# Patient Record
Sex: Female | Born: 1945 | Race: White | Hispanic: No | Smoking: Current every day smoker
Health system: Southern US, Community
[De-identification: ages and names within clinical notes are randomized; demographics above are authoritative.]

## PROBLEM LIST (undated history)

## (undated) DIAGNOSIS — N186 End stage renal disease: Secondary | ICD-10-CM

## (undated) HISTORY — PX: KNEE SURGERY: SHX244

## (undated) HISTORY — PX: HIP SURGERY: SHX245

## (undated) HISTORY — PX: GASTRIC BYPASS: SHX52

## (undated) HISTORY — DX: End stage renal disease: N18.6

## (undated) HISTORY — PX: CATARACT EXTRACTION: SUR2

## (undated) HISTORY — PX: BACK SURGERY: SHX140

## (undated) HISTORY — PX: VEIN LIGATION AND STRIPPING: SHX2653

## (undated) HISTORY — PX: HERNIA REPAIR: SHX51

## (undated) HISTORY — PX: CARDIAC PACEMAKER PLACEMENT: SHX583

## (undated) HISTORY — PX: CHOLECYSTECTOMY: SHX55

---

## 1978-07-30 DIAGNOSIS — I82419 Acute embolism and thrombosis of unspecified femoral vein: Secondary | ICD-10-CM | POA: Insufficient documentation

## 1978-07-30 HISTORY — DX: Acute embolism and thrombosis of unspecified femoral vein: I82.419

## 2000-04-19 ENCOUNTER — Encounter: Payer: Self-pay | Admitting: *Deleted

## 2000-04-19 ENCOUNTER — Encounter: Admission: RE | Admit: 2000-04-19 | Discharge: 2000-04-19 | Payer: Self-pay | Admitting: *Deleted

## 2000-04-20 ENCOUNTER — Other Ambulatory Visit: Admission: RE | Admit: 2000-04-20 | Discharge: 2000-04-20 | Payer: Self-pay | Admitting: *Deleted

## 2002-04-29 ENCOUNTER — Other Ambulatory Visit: Admission: RE | Admit: 2002-04-29 | Discharge: 2002-04-29 | Payer: Self-pay | Admitting: Obstetrics and Gynecology

## 2003-05-19 ENCOUNTER — Other Ambulatory Visit: Admission: RE | Admit: 2003-05-19 | Discharge: 2003-05-19 | Payer: Self-pay | Admitting: *Deleted

## 2006-07-18 ENCOUNTER — Encounter: Admission: RE | Admit: 2006-07-18 | Discharge: 2006-07-18 | Payer: Self-pay | Admitting: Orthopedic Surgery

## 2006-07-24 ENCOUNTER — Ambulatory Visit (HOSPITAL_BASED_OUTPATIENT_CLINIC_OR_DEPARTMENT_OTHER): Admission: RE | Admit: 2006-07-24 | Discharge: 2006-07-24 | Payer: Self-pay | Admitting: Orthopedic Surgery

## 2006-08-23 ENCOUNTER — Ambulatory Visit (HOSPITAL_BASED_OUTPATIENT_CLINIC_OR_DEPARTMENT_OTHER): Admission: RE | Admit: 2006-08-23 | Discharge: 2006-08-23 | Payer: Self-pay | Admitting: Orthopedic Surgery

## 2006-09-20 ENCOUNTER — Encounter: Admission: RE | Admit: 2006-09-20 | Discharge: 2006-09-20 | Payer: Self-pay | Admitting: *Deleted

## 2008-03-14 IMAGING — US US EXTREM LOW VENOUS*L*
1 series · 14 of 24 positions shown · non-contrast
Comparison: none

CLINICAL DATA: Left leg pain

LEFT LOWER EXTREMITY VENOUS DOPPLER ULTRASOUND:
TECHNIQUE: Gray-scale sonography with compression, as well as color and duplex
Doppler ultrasound, were performed to evaluate the deep venous system from the
level of the common femoral vein through the popliteal and proximal calf veins.

[Series 1: unknown · 14 of 32 slices shown]
[im 1/32]
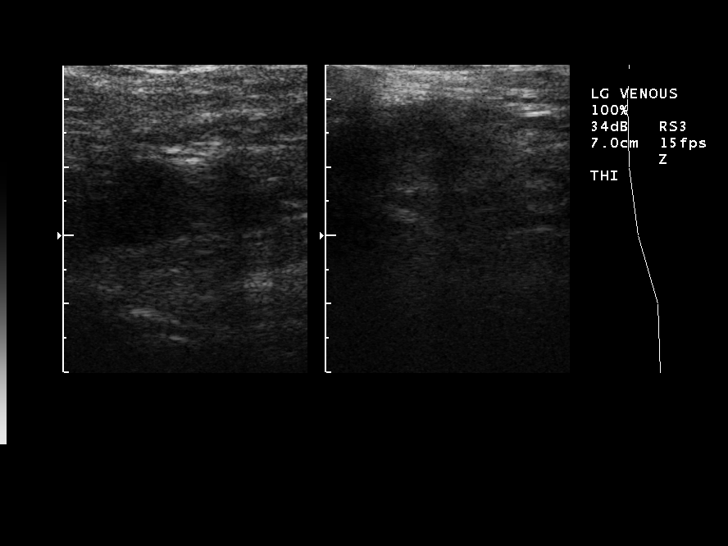
[im 3/32]
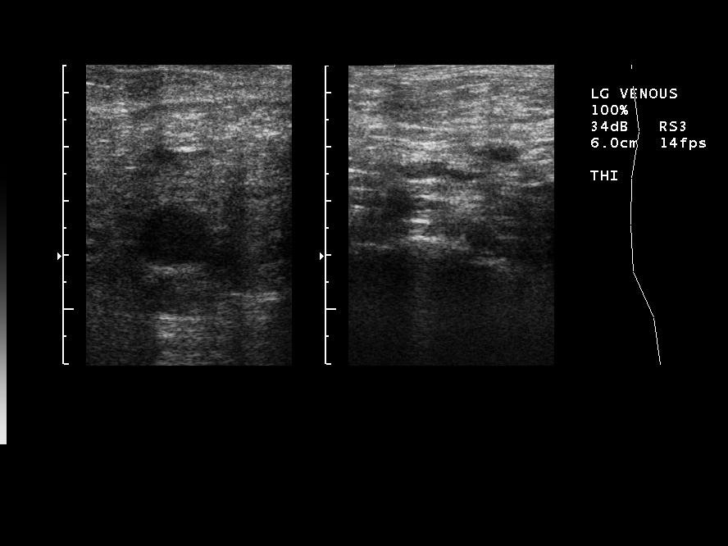
[im 6/32]
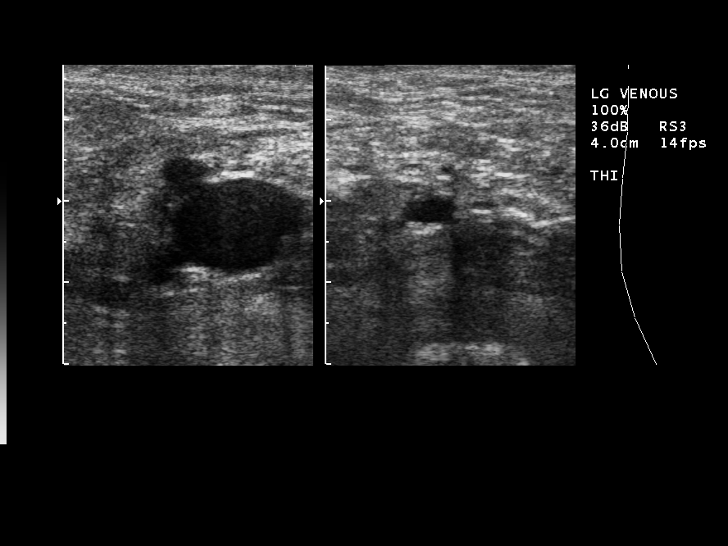
[im 9/32]
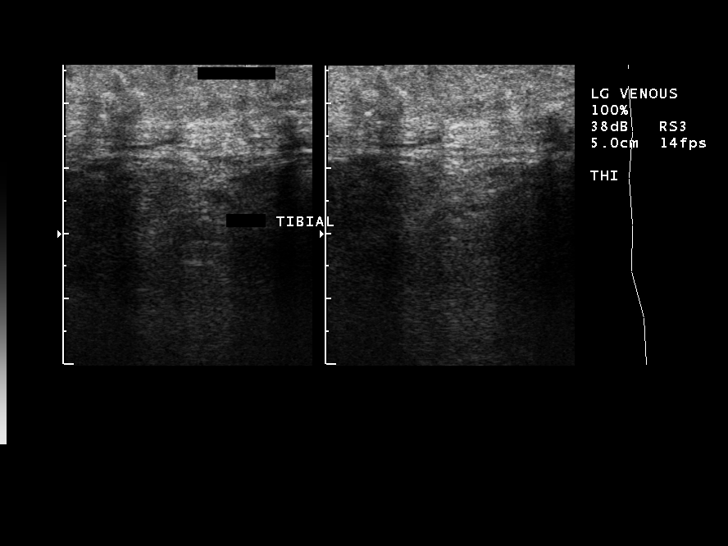
[im 10/32]
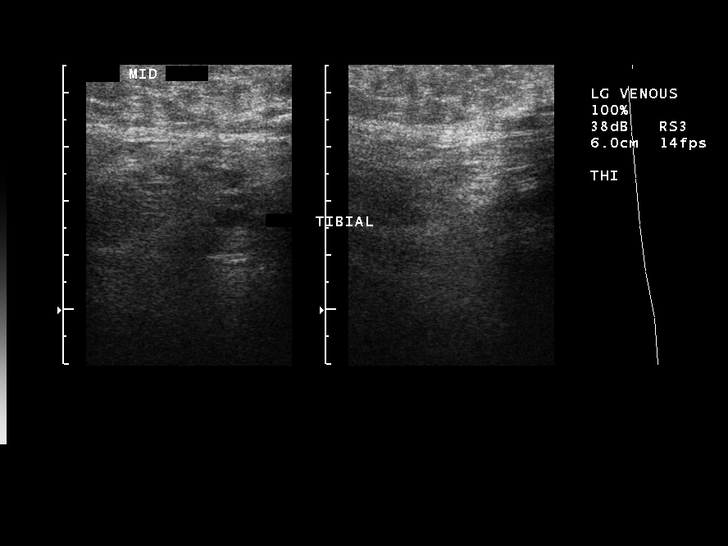
[im 13/32]
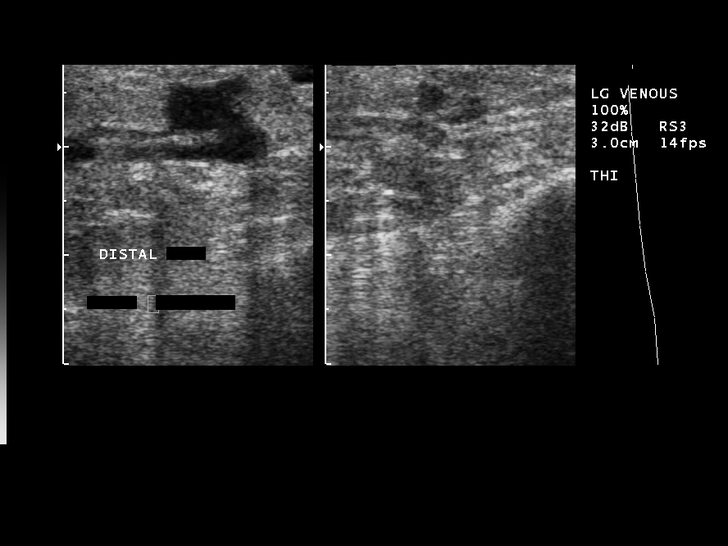
[im 15/32]
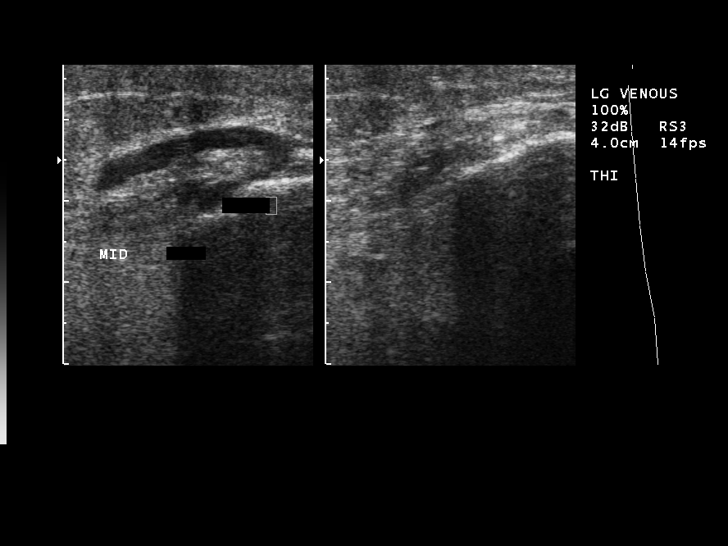
[im 17/32]
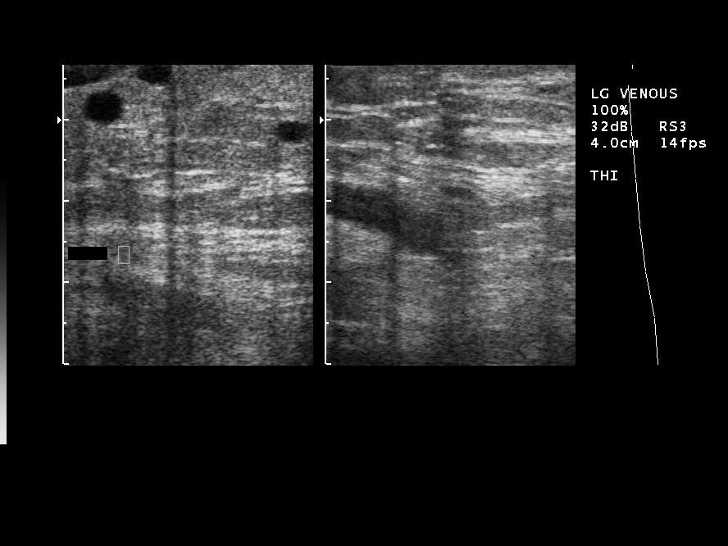
[im 19/32]
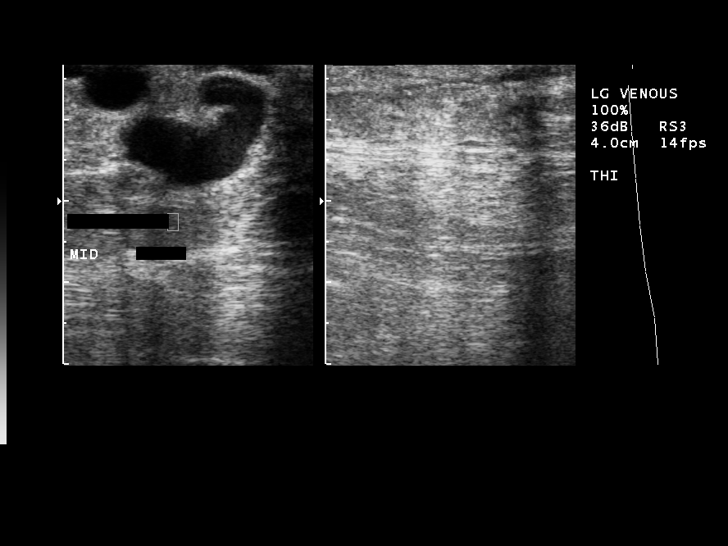
[im 22/32]
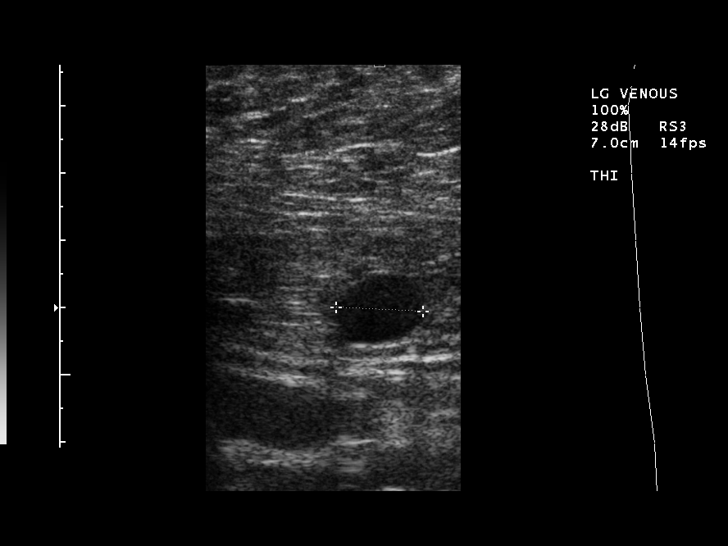
[im 25/32]
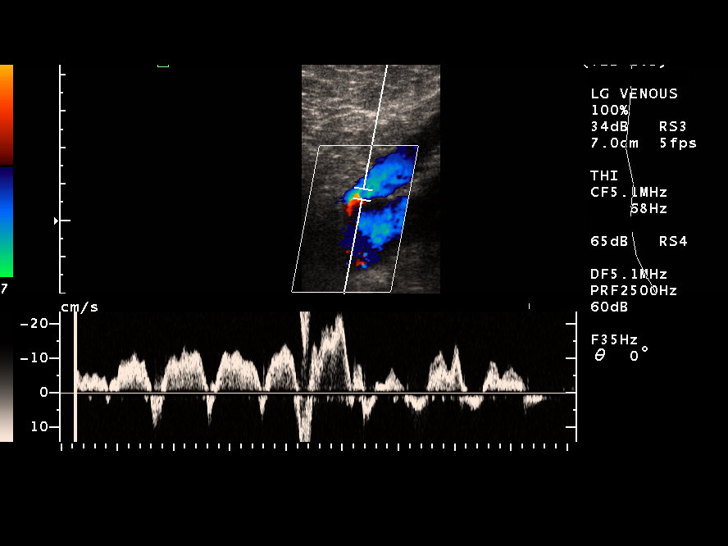
[im 26/32]
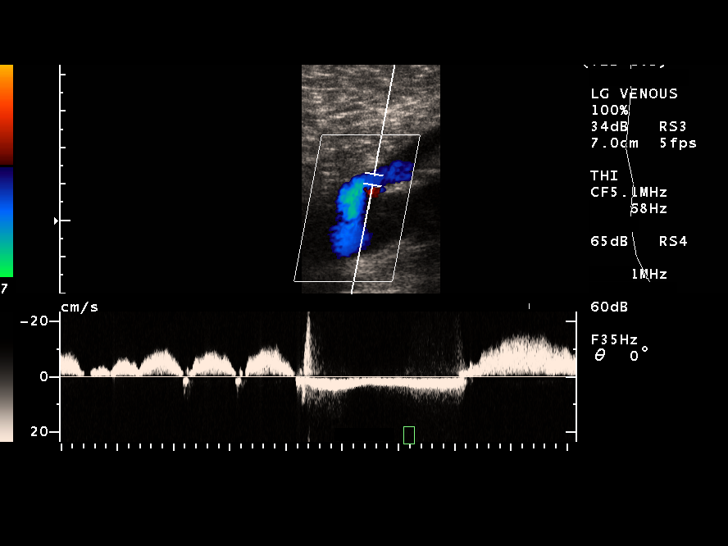
[im 29/32]
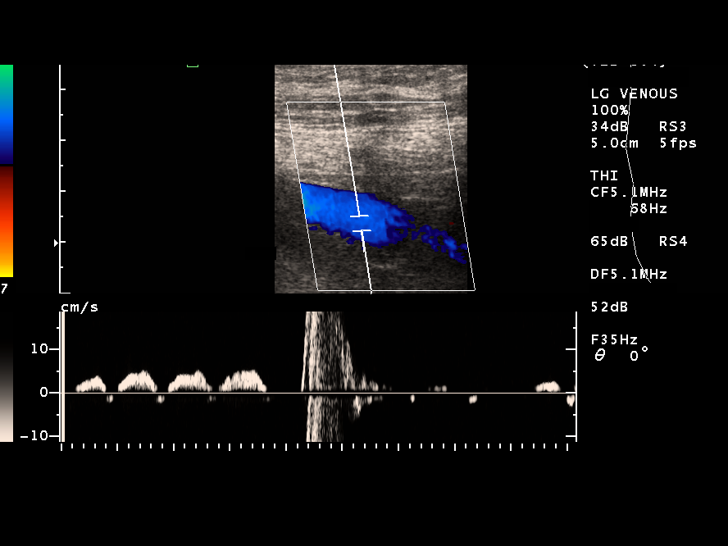
[im 32/32]
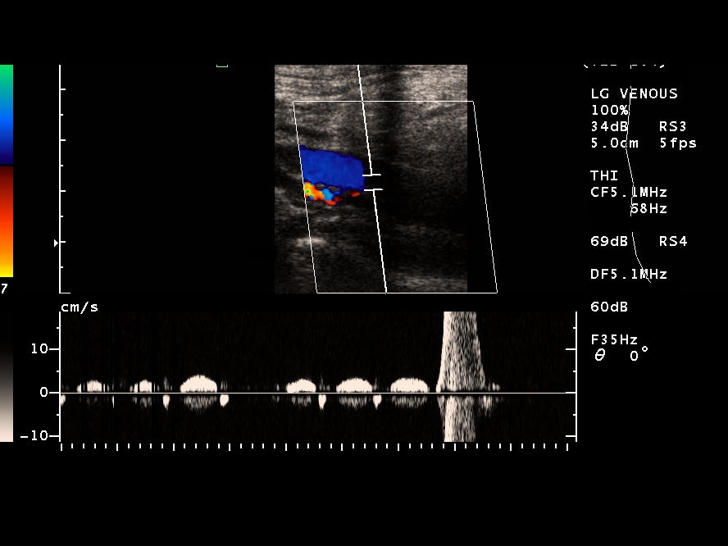

[14 of 24 positions shown; findings below may reference images not displayed]

FINDINGS: The deep venous system is unremarkable normal compressibility,
augmentation, and phasicity. There is reflux seen at the saphenofemoral junction
and within the left great saphenous vein which leads to large varicosities.
cm Baker's cyst noted posterior to the left knee.
IMPRESSION: No evidence of left lower extremity DVT.

There is great saphenous vein reflux and multiple varicosities noted. If
symptoms are felt to be referrable to venous insufficiency, the patient could
have a consultation and vein mapping for venous insufficiency.

## 2010-09-26 ENCOUNTER — Other Ambulatory Visit (HOSPITAL_COMMUNITY): Payer: Self-pay | Admitting: Nephrology

## 2010-09-26 DIAGNOSIS — R809 Proteinuria, unspecified: Secondary | ICD-10-CM

## 2010-09-28 ENCOUNTER — Other Ambulatory Visit: Payer: Self-pay | Admitting: Nephrology

## 2010-09-28 ENCOUNTER — Encounter (HOSPITAL_COMMUNITY): Payer: Self-pay

## 2010-09-28 ENCOUNTER — Encounter (HOSPITAL_COMMUNITY): Payer: Medicare Other | Attending: Nephrology

## 2010-09-28 DIAGNOSIS — N179 Acute kidney failure, unspecified: Secondary | ICD-10-CM | POA: Insufficient documentation

## 2010-09-28 DIAGNOSIS — D638 Anemia in other chronic diseases classified elsewhere: Secondary | ICD-10-CM | POA: Insufficient documentation

## 2010-09-28 LAB — POCT HEMOGLOBIN-HEMACUE: Hemoglobin: 8.3 g/dL — ABNORMAL LOW (ref 12.0–15.0)

## 2010-10-05 ENCOUNTER — Other Ambulatory Visit: Payer: Self-pay | Admitting: Interventional Radiology

## 2010-10-05 ENCOUNTER — Ambulatory Visit (HOSPITAL_COMMUNITY)
Admission: RE | Admit: 2010-10-05 | Discharge: 2010-10-05 | Disposition: A | Discharge status: Home / Self Care | Payer: Medicare Other | Source: Ambulatory Visit | Attending: Nephrology | Admitting: Nephrology

## 2010-10-05 DIAGNOSIS — R809 Proteinuria, unspecified: Secondary | ICD-10-CM

## 2010-10-05 DIAGNOSIS — I129 Hypertensive chronic kidney disease with stage 1 through stage 4 chronic kidney disease, or unspecified chronic kidney disease: Secondary | ICD-10-CM | POA: Insufficient documentation

## 2010-10-05 DIAGNOSIS — N189 Chronic kidney disease, unspecified: Secondary | ICD-10-CM | POA: Insufficient documentation

## 2010-10-05 LAB — CBC
Hemoglobin: 8.2 g/dL — ABNORMAL LOW (ref 12.0–15.0)
MCH: 29.3 pg (ref 26.0–34.0)
MCHC: 31.7 g/dL (ref 30.0–36.0)
RDW: 14.6 % (ref 11.5–15.5)

## 2010-10-05 LAB — PROTIME-INR: Prothrombin Time: 14.8 seconds (ref 11.6–15.2)

## 2010-10-07 ENCOUNTER — Encounter (HOSPITAL_COMMUNITY)
Admission: RE | Admit: 2010-10-07 | Discharge: 2010-10-07 | Disposition: A | Discharge status: Home / Self Care | Payer: Medicare Other | Source: Ambulatory Visit | Attending: Nephrology | Admitting: Nephrology

## 2010-10-07 ENCOUNTER — Other Ambulatory Visit: Payer: Self-pay | Admitting: Nephrology

## 2010-10-07 DIAGNOSIS — D638 Anemia in other chronic diseases classified elsewhere: Secondary | ICD-10-CM | POA: Insufficient documentation

## 2010-10-07 DIAGNOSIS — N179 Acute kidney failure, unspecified: Secondary | ICD-10-CM | POA: Insufficient documentation

## 2010-10-17 ENCOUNTER — Encounter (HOSPITAL_COMMUNITY): Payer: Medicare Other

## 2010-10-19 ENCOUNTER — Other Ambulatory Visit: Payer: Self-pay | Admitting: Nephrology

## 2010-10-19 ENCOUNTER — Encounter (HOSPITAL_COMMUNITY): Payer: Medicare Other

## 2010-10-19 LAB — IRON AND TIBC
Iron: 32 ug/dL — ABNORMAL LOW (ref 42–135)
Saturation Ratios: 13 % — ABNORMAL LOW (ref 20–55)
UIBC: 214 ug/dL

## 2010-10-19 LAB — FERRITIN: Ferritin: 646 ng/mL — ABNORMAL HIGH (ref 10–291)

## 2010-10-19 LAB — POCT HEMOGLOBIN-HEMACUE: Hemoglobin: 10.6 g/dL — ABNORMAL LOW (ref 12.0–15.0)

## 2010-10-26 ENCOUNTER — Ambulatory Visit (INDEPENDENT_AMBULATORY_CARE_PROVIDER_SITE_OTHER): Payer: Medicare Other | Admitting: Vascular Surgery

## 2010-10-26 ENCOUNTER — Other Ambulatory Visit: Payer: Self-pay | Admitting: Nephrology

## 2010-10-26 ENCOUNTER — Encounter (INDEPENDENT_AMBULATORY_CARE_PROVIDER_SITE_OTHER): Payer: Medicare Other

## 2010-10-26 ENCOUNTER — Encounter (HOSPITAL_COMMUNITY): Payer: Medicare Other

## 2010-10-26 DIAGNOSIS — N184 Chronic kidney disease, stage 4 (severe): Secondary | ICD-10-CM

## 2010-10-26 DIAGNOSIS — Z01810 Encounter for preprocedural cardiovascular examination: Secondary | ICD-10-CM

## 2010-10-26 DIAGNOSIS — N186 End stage renal disease: Secondary | ICD-10-CM

## 2010-10-27 NOTE — Consult Note (Signed)
NEW PATIENT CONSULTATION  Holly Pittman, Holly Pittman DOB:  12-06-1945                                       10/26/2010 [id number]  I saw patient in the office today to evaluate her for hemodialysis access.  She is referred by Dr. Justin Mend.  This is Pittman pleasant 65 year old woman with gradually worsening renal insufficiency of unknown etiology. In reviewing Dr. Jason Nest records, it appears she has an inflammatory disorder involving her kidneys.  She has apparently undergone Pittman renal biopsy, but I do not have those results.  She has had some generalized fatigue and weakness.  She has had no other uremic symptoms. Specifically, she denies nausea, vomiting, palpitations, shortness of breath, or sweats.  PAST MEDICAL HISTORY:  Significant for hypothyroidism, fibromyalgia, and benign hypertension.  She denies any history of diabetes, hypercholesterolemia, history of previous myocardial infarction, or history of congestive heart failure.  SOCIAL HISTORY:  She is single.  She has 2 children.  She smokes less than 1 pack per day of cigarettes.  She does not drink alcohol on Pittman regular basis.  FAMILY HISTORY:  She is unaware of any history of premature cardiovascular disease.  REVIEW OF SYSTEMS:  GENERAL:  She has had some weight loss and loss of appetite. CARDIOVASCULAR:  She has had no chest pain, chest pressure, palpitations, or arrhythmias.  She has had no history of stroke, TIAs or amaurosis fugax.  She has had no history of DVT or phlebitis. GI:  She has occasional constipation. NEUROLOGIC:  She has occasional dizziness. HEMATOLOGIC:  She does have Pittman history of anemia. GU:  She has some urinary frequency. MUSCULOSKELETAL:  She does have Pittman history of arthritis, joint pain, and muscle pain. PSYCHIATRIC:  She has had issues with depression and anxiety. Pulmonary, integumentary review of systems is unremarkable and is documented on the medical history form in her  chart.  PHYSICAL EXAMINATION:  This is Pittman pleasant 65 year old woman who appears her stated age.  Blood pressure is 114/72, heart rate is 83, saturation 98%.  HEENT:  Unremarkable.  Lungs are clear bilaterally to auscultation without rales, rhonchi or wheezing.  Cardiovascular:  I do not detect any carotid bruits.  She has Pittman regular rate and rhythm.  Abdomen:  Soft and nontender with normal-pitched bowel sounds.  Musculoskeletal:  No major deformities or cyanosis.  Extremities:  She has palpable brachial and radial pulses bilaterally.  Neurologic:  She has no focal weakness or paresthesias.  Skin:  There are no ulcers or rashes.  I have independently interpreted her vein mapping, which shows that her forearm cephalic vein on the left is very small.  She does not appear to be Pittman candidate for Pittman radiocephalic fistula.  The upper arm cephalic vein appears reasonable in size, and the basilic vein appears reasonable in size on the left.  Cephalic vein on the right is marginal in size in the forearm and marginal in size in the upper arm.  I have also reviewed her labs that were sent from Dr. Jason Nest office on October 11, 2010.  Hemoglobin was 10.6, creatinine 5.83.  I have recommended that we explore her upper arm cephalic vein on the left, and if this is adequate, place an upper arm fistula.  If this is not adequate, we could explore her basilic vein for Pittman possible basilic vein transposition.  If neither is  adequate, she could potentially require an AV graft.  I have discussed the indications for surgery and the potential complications, including but not limited to bleeding, wound healing problems, failure of the fistula to mature, graft thrombosis, graft infection, or steal syndrome.  All of her questions were answered.  She is agreeable to proceed.  Her surgery has been scheduled for May 1.    Judeth Cornfield. Scot Dock, M.D. Electronically Signed  CSD/MEDQ  D:  10/26/2010  T:  10/27/2010   Job:  [id number]  cc:   Sherril Croon, M.D.

## 2010-11-01 ENCOUNTER — Ambulatory Visit (HOSPITAL_COMMUNITY): Payer: Medicare Other

## 2010-11-01 ENCOUNTER — Ambulatory Visit (HOSPITAL_COMMUNITY)
Admission: RE | Admit: 2010-11-01 | Discharge: 2010-11-01 | Disposition: A | Discharge status: Home / Self Care | Payer: Medicare Other | Source: Ambulatory Visit | Attending: Vascular Surgery | Admitting: Vascular Surgery

## 2010-11-01 DIAGNOSIS — Z01810 Encounter for preprocedural cardiovascular examination: Secondary | ICD-10-CM | POA: Insufficient documentation

## 2010-11-01 DIAGNOSIS — N186 End stage renal disease: Secondary | ICD-10-CM

## 2010-11-01 DIAGNOSIS — I129 Hypertensive chronic kidney disease with stage 1 through stage 4 chronic kidney disease, or unspecified chronic kidney disease: Secondary | ICD-10-CM | POA: Insufficient documentation

## 2010-11-01 DIAGNOSIS — Z01818 Encounter for other preprocedural examination: Secondary | ICD-10-CM | POA: Insufficient documentation

## 2010-11-01 DIAGNOSIS — I120 Hypertensive chronic kidney disease with stage 5 chronic kidney disease or end stage renal disease: Secondary | ICD-10-CM

## 2010-11-01 DIAGNOSIS — F3289 Other specified depressive episodes: Secondary | ICD-10-CM | POA: Insufficient documentation

## 2010-11-01 DIAGNOSIS — IMO0001 Reserved for inherently not codable concepts without codable children: Secondary | ICD-10-CM | POA: Insufficient documentation

## 2010-11-01 DIAGNOSIS — F329 Major depressive disorder, single episode, unspecified: Secondary | ICD-10-CM | POA: Insufficient documentation

## 2010-11-01 DIAGNOSIS — N189 Chronic kidney disease, unspecified: Secondary | ICD-10-CM | POA: Insufficient documentation

## 2010-11-01 DIAGNOSIS — Z01812 Encounter for preprocedural laboratory examination: Secondary | ICD-10-CM | POA: Insufficient documentation

## 2010-11-01 LAB — POCT I-STAT 4, (NA,K, GLUC, HGB,HCT)
Glucose, Bld: 75 mg/dL (ref 70–99)
HCT: 39 % (ref 36.0–46.0)

## 2010-11-01 LAB — SURGICAL PCR SCREEN: Staphylococcus aureus: NEGATIVE

## 2010-11-02 ENCOUNTER — Encounter (HOSPITAL_COMMUNITY): Payer: Medicare Other

## 2010-11-02 NOTE — Op Note (Signed)
  NAMECOURTANY, Holly Pittman             ACCOUNT NO.:  [id number]  MEDICAL RECORD NO.:  [id number]           PATIENT TYPE:  O  LOCATION:  [facility]                         FACILITY:  [facility]  PHYSICIAN:  Judeth Cornfield. Scot Dock, M.D.DATE OF BIRTH:  [birth year]  DATE OF PROCEDURE:  11/01/2010 DATE OF DISCHARGE:  11/01/2010                              OPERATIVE REPORT   PREOPERATIVE DIAGNOSIS:  Chronic kidney disease.  POSTOPERATIVE DIAGNOSIS:  Chronic kidney disease.  PROCEDURE:  Placement of a left brachiocephalic AV fistula.  SURGEON:  Judeth Cornfield. Scot Dock, MD  ANESTHESIA:  Local with sedation.  TECHNIQUE:  The patient was taken to the operating room, sedated by Anesthesia.  The left upper extremity was prepped and draped in usual sterile fashion.  The upper arm cephalic vein appeared to be adequate size for fistula by preoperative duplex.  Transverse incision was made just above the antecubital level and here the cephalic vein was dissected free and ligated distally, irrigated it nicely with heparinized saline.  The brachial artery was dissected free beneath the fascia and the patient was heparinized.  The brachial artery was clamped proximally and distally and a longitudinal arteriotomy was made.  The vein was cut to the appropriate length and sewn end-to-side to the artery using continuous 6-0 Prolene suture.  At the completion, there was an excellent thrill in the fistula with a palpable radial pulse. Heparin was partially reversed with protamine.  The wound was closed with deep layer of 3-0 Vicryl.  The skin was closed with 4-0 Vicryl. Sterile dressing was applied.  The patient tolerated the procedure well and was transferred to the recovery room in stable condition.  All needle and sponge counts were correct.     Judeth Cornfield. Scot Dock, M.D.     CSD/MEDQ  D:  11/01/2010  T:  11/02/2010  Job:  [id number]  cc:   Sherril Croon, M.D.  Electronically Signed by Deitra Mayo M.D. on 11/02/2010 08:00:47 AM

## 2010-11-02 NOTE — Procedures (Unsigned)
CEPHALIC VEIN MAPPING  INDICATION:  Chronic kidney disease, stage IV.  HISTORY: Currently smoking.  EXAM: The right cephalic vein is compressible with diameter measurements ranging from 0.09 cm to 0.37 cm.  The right basilic vein is compressible with diameter measurements ranging from 0.29 cm to 0.44 cm.  The left cephalic vein is compressible with diameter measurements ranging from 0.10 to 0.44 cm.  The left basilic vein is compressible with diameter measurements ranging from 0.27 to 0.33 cm.  See attached worksheet for all measurements.  IMPRESSION: 1. Patent bilateral cephalic and basilic veins with diameter     measurements as described above. 2. Bilateral basilic veins are noted to bifurcate at the distal upper     arm/antecubital fossa segment.  ___________________________________________ Judeth Cornfield. Scot Dock, M.D.  SH/MEDQ  D:  10/26/2010  T:  10/26/2010  Job:  [id number]

## 2010-11-15 ENCOUNTER — Encounter (HOSPITAL_COMMUNITY): Payer: Medicare Other

## 2010-11-24 ENCOUNTER — Other Ambulatory Visit: Payer: Self-pay | Admitting: Nephrology

## 2010-11-24 ENCOUNTER — Encounter (HOSPITAL_COMMUNITY): Payer: Medicare Other | Attending: Nephrology

## 2010-11-24 DIAGNOSIS — N179 Acute kidney failure, unspecified: Secondary | ICD-10-CM | POA: Insufficient documentation

## 2010-11-24 DIAGNOSIS — D638 Anemia in other chronic diseases classified elsewhere: Secondary | ICD-10-CM | POA: Insufficient documentation

## 2010-11-24 LAB — POCT HEMOGLOBIN-HEMACUE: Hemoglobin: 11.9 g/dL — ABNORMAL LOW (ref 12.0–15.0)

## 2010-11-24 LAB — IRON AND TIBC: Iron: 105 ug/dL (ref 42–135)

## 2010-12-01 ENCOUNTER — Encounter (HOSPITAL_COMMUNITY): Payer: Medicare Other

## 2010-12-08 ENCOUNTER — Other Ambulatory Visit: Payer: Self-pay | Admitting: Nephrology

## 2010-12-08 ENCOUNTER — Encounter (HOSPITAL_COMMUNITY): Payer: Medicare Other | Attending: Nephrology

## 2010-12-08 DIAGNOSIS — N179 Acute kidney failure, unspecified: Secondary | ICD-10-CM | POA: Insufficient documentation

## 2010-12-08 DIAGNOSIS — D638 Anemia in other chronic diseases classified elsewhere: Secondary | ICD-10-CM | POA: Insufficient documentation

## 2010-12-14 ENCOUNTER — Ambulatory Visit (INDEPENDENT_AMBULATORY_CARE_PROVIDER_SITE_OTHER): Payer: Medicare Other | Admitting: Vascular Surgery

## 2010-12-14 DIAGNOSIS — N186 End stage renal disease: Secondary | ICD-10-CM

## 2010-12-15 NOTE — Assessment & Plan Note (Signed)
OFFICE VISIT  BAMBIE, MCRAE A DOB:  09-12-1945                                       12/15/2010 [id number]  I saw the patient in the office today for follow-up after her left brachiocephalic fistula was placed on Nov 01, 2010.  She comes in for a 6- week follow-up visit.  Overall she is doing well and has no specific complaints.  She has had no arm pain or hand pain.  PHYSICAL EXAMINATION:  Vital signs:  Blood pressure is 151/67, heart rate is 73, temperature is 97.4.  Incision in the left arm is healing nicely.  Her fistula has an excellent thrill.  It appears to be maturing nicely.  The left hand is warm and well-perfused.  Overall I am pleased with her progress.  I think this should provide adequate access if and when she needs it.  We will see her back p.r.n.    Judeth Cornfield. Scot Dock, M.D. Electronically Signed  CSD/MEDQ  D:  12/14/2010  T:  12/15/2010  Job:  [id number]  cc:   Sherril Croon, M.D.

## 2010-12-22 ENCOUNTER — Encounter (HOSPITAL_COMMUNITY): Payer: Medicare Other

## 2010-12-23 ENCOUNTER — Encounter (HOSPITAL_COMMUNITY): Payer: Medicare Other

## 2011-01-09 ENCOUNTER — Other Ambulatory Visit: Payer: Self-pay | Admitting: Nephrology

## 2011-01-09 ENCOUNTER — Encounter (HOSPITAL_COMMUNITY)
Admission: RE | Admit: 2011-01-09 | Discharge: 2011-01-09 | Disposition: A | Discharge status: Home / Self Care | Payer: Medicare Other | Source: Ambulatory Visit | Attending: Nephrology | Admitting: Nephrology

## 2011-01-09 DIAGNOSIS — D638 Anemia in other chronic diseases classified elsewhere: Secondary | ICD-10-CM | POA: Insufficient documentation

## 2011-01-09 DIAGNOSIS — N179 Acute kidney failure, unspecified: Secondary | ICD-10-CM | POA: Insufficient documentation

## 2011-01-09 LAB — IRON AND TIBC
Iron: 103 ug/dL (ref 42–135)
Saturation Ratios: 48 % (ref 20–55)
TIBC: 215 ug/dL — ABNORMAL LOW (ref 250–470)
UIBC: 112 ug/dL

## 2011-01-09 LAB — FERRITIN: Ferritin: 296 ng/mL — ABNORMAL HIGH (ref 10–291)

## 2011-01-25 ENCOUNTER — Other Ambulatory Visit: Payer: Self-pay | Admitting: Nephrology

## 2011-01-25 ENCOUNTER — Encounter (HOSPITAL_COMMUNITY): Payer: Medicare Other

## 2011-02-08 ENCOUNTER — Encounter (HOSPITAL_COMMUNITY): Payer: Medicare Other

## 2011-02-14 ENCOUNTER — Encounter (HOSPITAL_COMMUNITY): Payer: Medicare Other

## 2011-02-20 ENCOUNTER — Other Ambulatory Visit: Payer: Self-pay | Admitting: Nephrology

## 2011-02-20 ENCOUNTER — Encounter (HOSPITAL_COMMUNITY): Payer: Medicare Other | Attending: Nephrology

## 2011-02-20 DIAGNOSIS — N179 Acute kidney failure, unspecified: Secondary | ICD-10-CM | POA: Insufficient documentation

## 2011-02-20 DIAGNOSIS — D638 Anemia in other chronic diseases classified elsewhere: Secondary | ICD-10-CM | POA: Insufficient documentation

## 2011-02-21 LAB — POCT HEMOGLOBIN-HEMACUE: Hemoglobin: 10.3 g/dL — ABNORMAL LOW (ref 12.0–15.0)

## 2011-02-27 ENCOUNTER — Encounter (HOSPITAL_COMMUNITY): Payer: Medicare Other

## 2011-03-02 ENCOUNTER — Encounter (HOSPITAL_COMMUNITY): Payer: Medicare Other

## 2011-03-08 ENCOUNTER — Encounter (HOSPITAL_COMMUNITY): Payer: Medicare Other

## 2011-03-13 ENCOUNTER — Encounter (HOSPITAL_COMMUNITY): Payer: Medicare Other

## 2011-05-08 DIAGNOSIS — M1990 Unspecified osteoarthritis, unspecified site: Secondary | ICD-10-CM

## 2011-05-08 DIAGNOSIS — Z9884 Bariatric surgery status: Secondary | ICD-10-CM | POA: Insufficient documentation

## 2011-05-08 HISTORY — DX: Bariatric surgery status: Z98.84

## 2011-05-08 HISTORY — DX: Unspecified osteoarthritis, unspecified site: M19.90

## 2011-09-27 DIAGNOSIS — M25559 Pain in unspecified hip: Secondary | ICD-10-CM

## 2011-09-27 DIAGNOSIS — Z96659 Presence of unspecified artificial knee joint: Secondary | ICD-10-CM

## 2011-09-27 HISTORY — DX: Pain in unspecified hip: M25.559

## 2011-09-27 HISTORY — DX: Presence of unspecified artificial knee joint: Z96.659

## 2011-10-11 ENCOUNTER — Other Ambulatory Visit: Payer: Self-pay | Admitting: Vascular Surgery

## 2012-03-29 IMAGING — US US BIOPSY
1 series · 7 of 7 positions shown · non-contrast
Comparison: None available

CLINICAL DATA: Proteinuria

ULTRASOUND GUIDED RENAL CORE BIOPSY

[Series 1: us biopsy · 0.28mm/px · 7 of 7 slices shown]
[im 1/7]
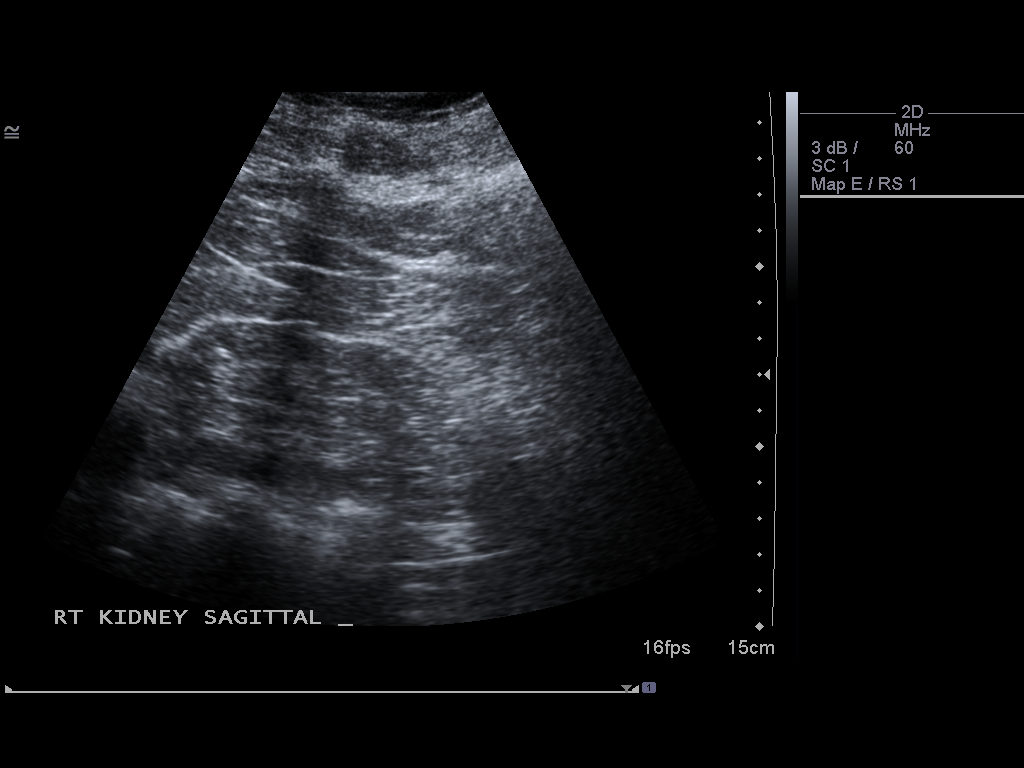
[im 2/7]
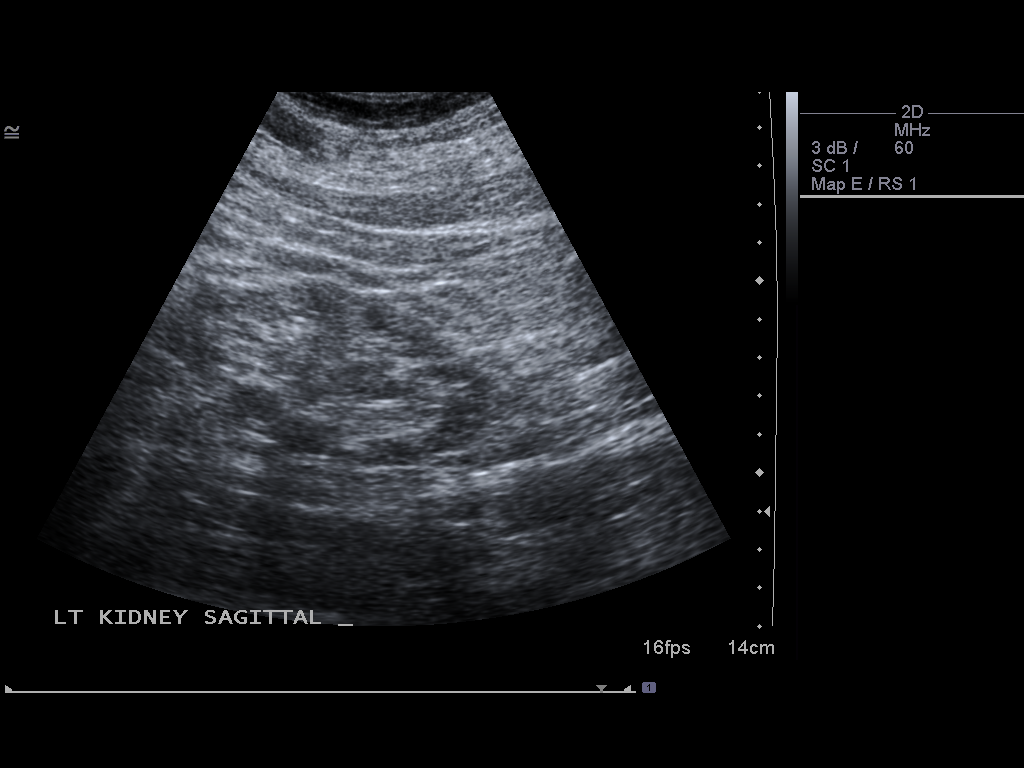
[im 3/7]
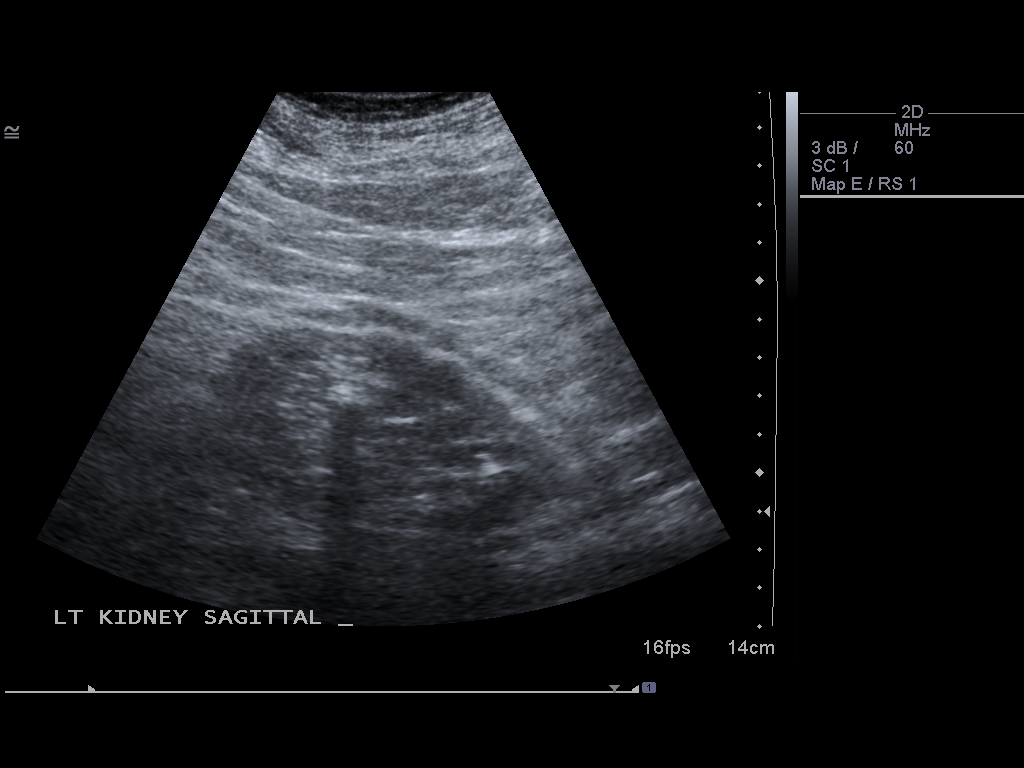
[im 4/7]
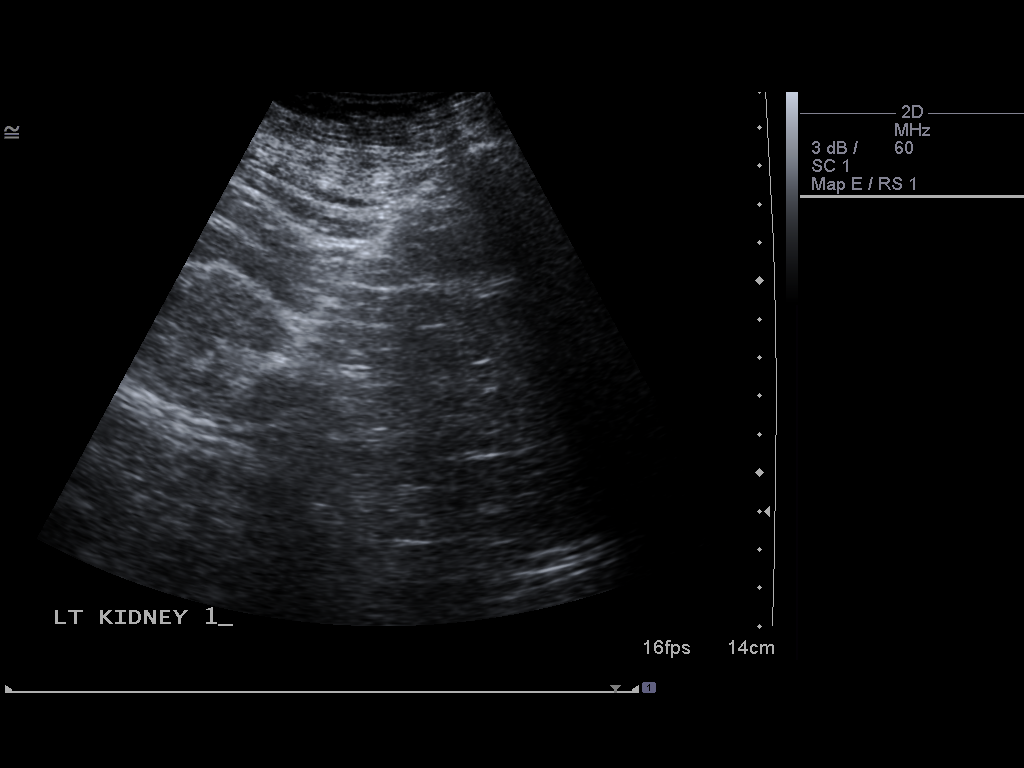
[im 5/7]
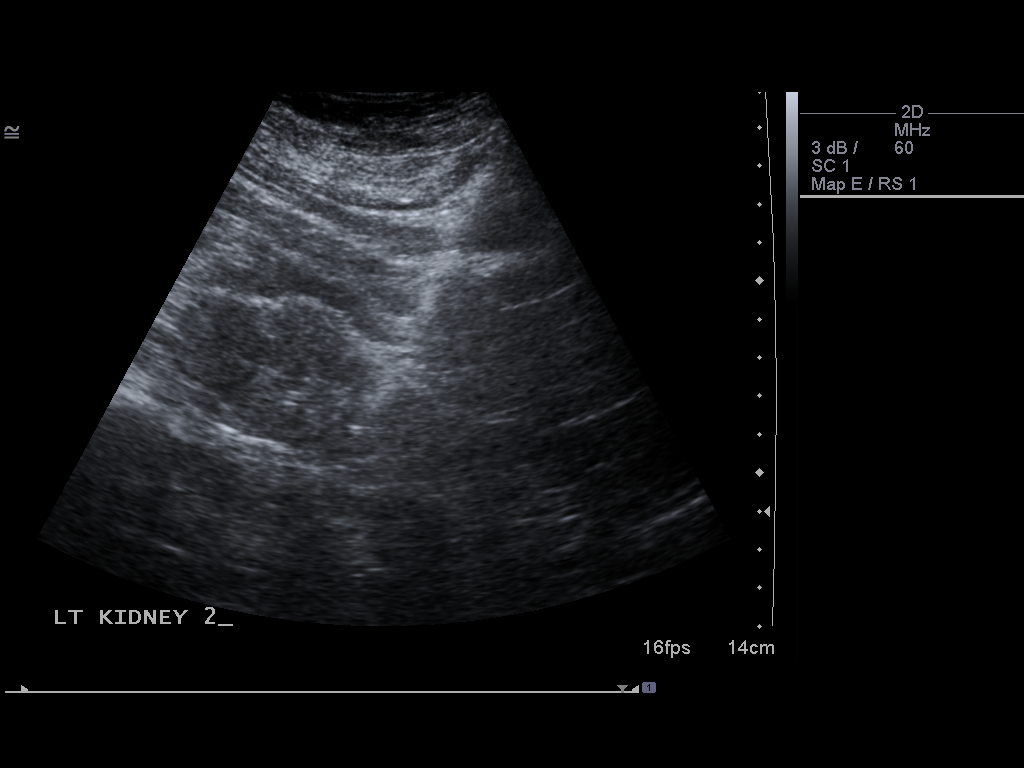
[im 6/7]
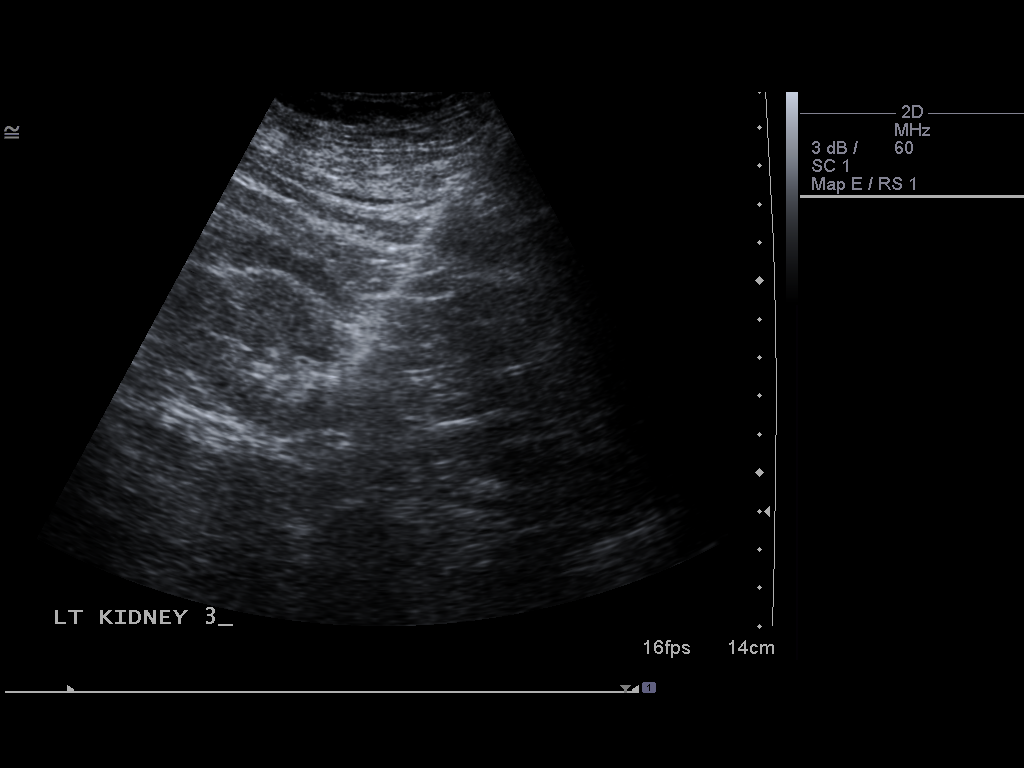
[im 7/7]
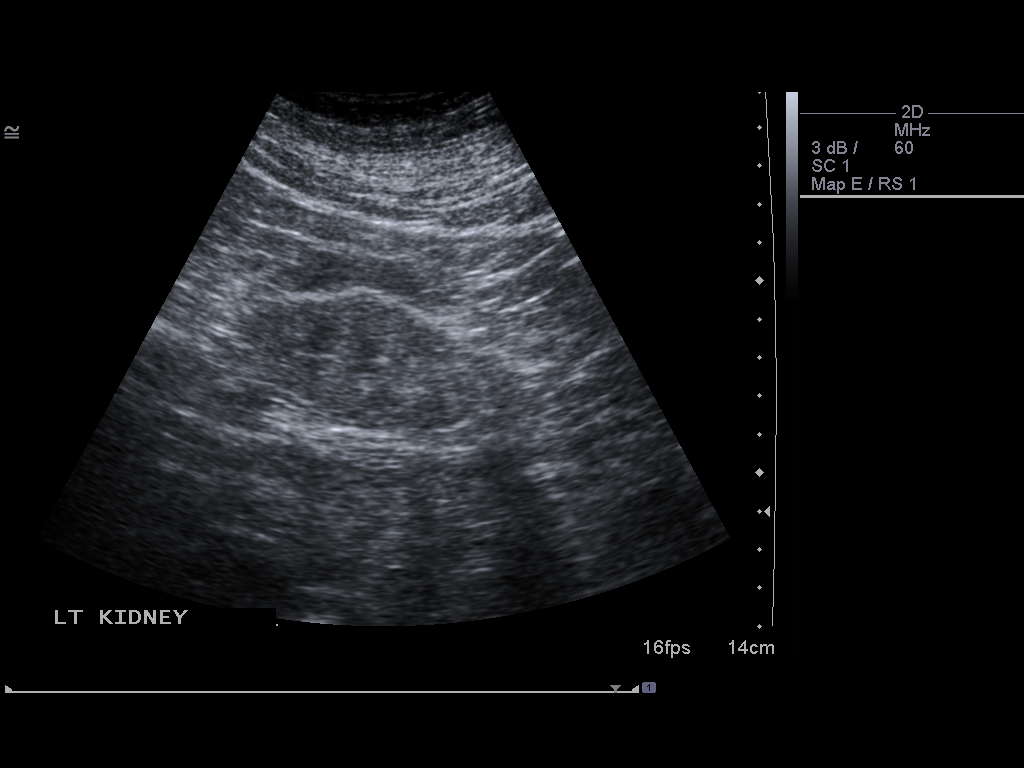

[7 of 7 positions shown; findings below may reference images not displayed]

Technique and findings:  Survey ultrasound was performed and an
appropriate skin entry site was localized.  Site was marked,
prepped with Betadine, draped in usual sterile fashion, infiltrated
locally with 1% lidocaine. Intravenous Fentanyl and Versed were
administered as conscious sedation during continuous
cardiorespiratory monitoring by the radiology RN with a total
moderate sedation time of 07minutes.
Under real time ultrasound guidance, a 16 gauge Biopince core
needle was advanced to the margin of the lower pole of the left
kidney for 3 passes.  The core samples were submitted to pathology.
The patient tolerated procedure well, with no immediate
complication.

IMPRESSION
1.  Technically successful ultrasound-guided core renal biopsy

## 2012-04-25 IMAGING — CR DG CHEST 2V
2 series · 2 of 2 positions shown · non-contrast
Comparison: 05/19/2007.

CLINICAL DATA: 64-year-old female preoperative study for left arm
AV fistula.  Hypertension, renal disease.

CHEST - 2 VIEW

[view not recorded (1 of 2)]
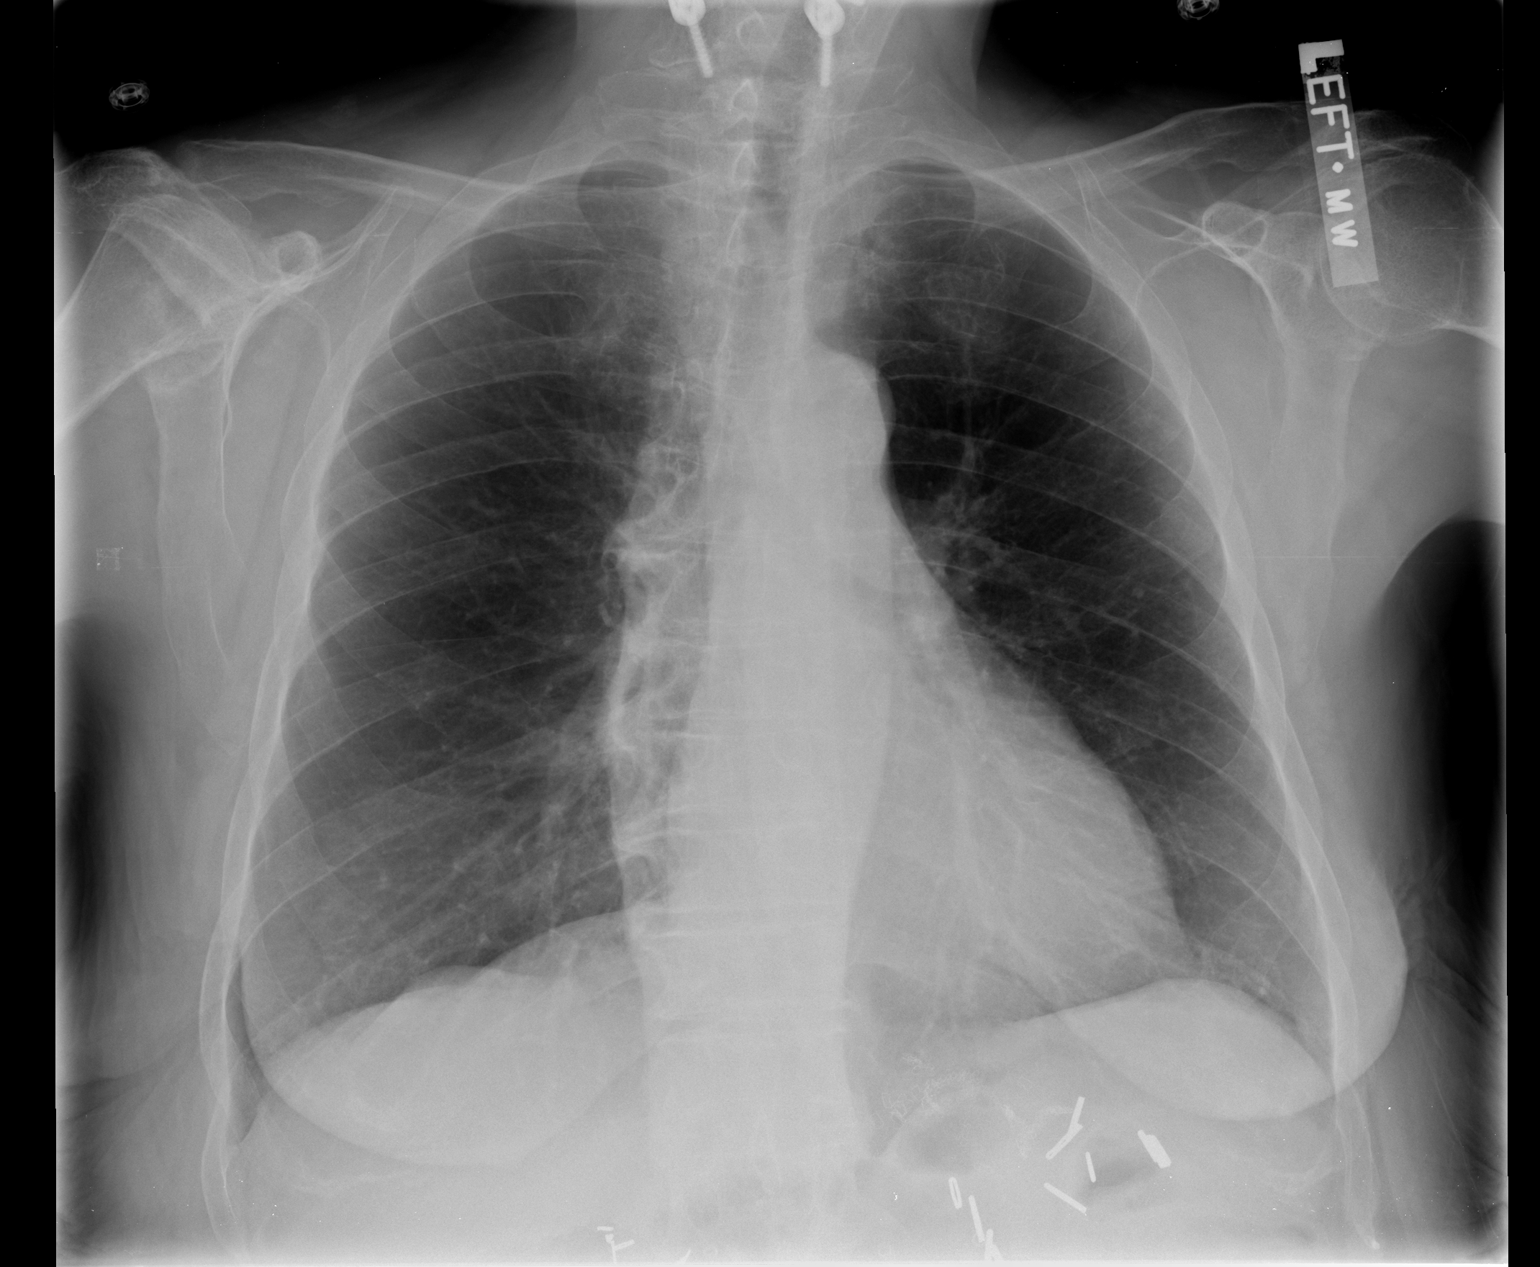

[view not recorded (2 of 2)]
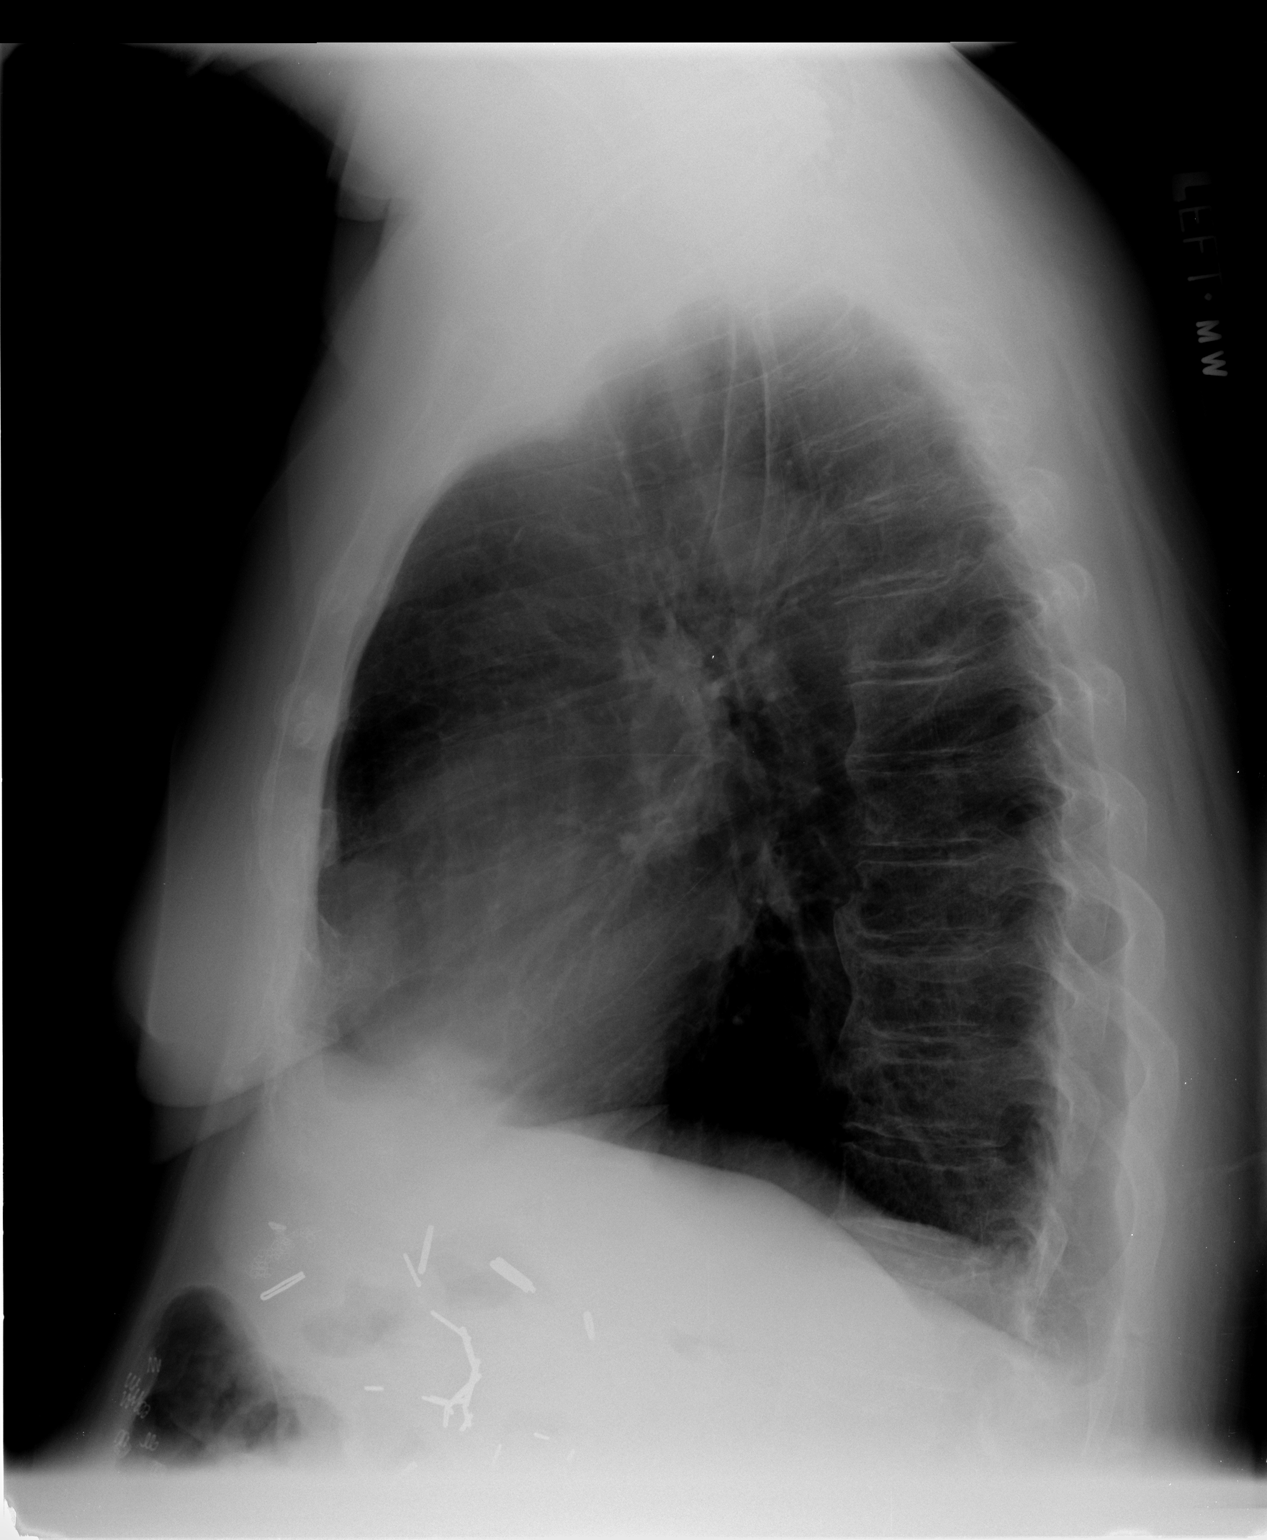

[2 of 2 positions shown; findings below may reference images not displayed]

FINDINGS: Unchanged appearance of left first rib costochondral
asymmetry.  Stable lung volumes.  Mild cardiomegaly. Other
mediastinal contours are within normal limits.  No pneumothorax,
pulmonary edema, pleural effusion or acute pulmonary opacity.
Stable visualized osseous structures.  Postoperative changes to the
lower cervical spine and epigastric region.
IMPRESSION: No acute cardiopulmonary abnormality.

## 2013-04-15 DIAGNOSIS — M5416 Radiculopathy, lumbar region: Secondary | ICD-10-CM | POA: Insufficient documentation

## 2013-04-15 DIAGNOSIS — M5440 Lumbago with sciatica, unspecified side: Secondary | ICD-10-CM

## 2013-04-15 HISTORY — DX: Lumbago with sciatica, unspecified side: M54.40

## 2013-04-15 HISTORY — DX: Radiculopathy, lumbar region: M54.16

## 2014-06-09 DIAGNOSIS — K838 Other specified diseases of biliary tract: Secondary | ICD-10-CM

## 2014-06-09 HISTORY — DX: Other specified diseases of biliary tract: K83.8

## 2015-06-24 DIAGNOSIS — N186 End stage renal disease: Secondary | ICD-10-CM | POA: Diagnosis not present

## 2015-06-24 DIAGNOSIS — D631 Anemia in chronic kidney disease: Secondary | ICD-10-CM | POA: Diagnosis not present

## 2015-07-26 DIAGNOSIS — F17200 Nicotine dependence, unspecified, uncomplicated: Secondary | ICD-10-CM

## 2015-07-26 HISTORY — DX: Nicotine dependence, unspecified, uncomplicated: F17.200

## 2015-09-15 DIAGNOSIS — D631 Anemia in chronic kidney disease: Secondary | ICD-10-CM | POA: Diagnosis not present

## 2015-09-15 DIAGNOSIS — N186 End stage renal disease: Secondary | ICD-10-CM | POA: Diagnosis not present

## 2015-12-16 DIAGNOSIS — N186 End stage renal disease: Secondary | ICD-10-CM

## 2015-12-16 DIAGNOSIS — D631 Anemia in chronic kidney disease: Secondary | ICD-10-CM

## 2016-04-09 DIAGNOSIS — N179 Acute kidney failure, unspecified: Secondary | ICD-10-CM | POA: Insufficient documentation

## 2016-04-09 DIAGNOSIS — N189 Chronic kidney disease, unspecified: Secondary | ICD-10-CM | POA: Insufficient documentation

## 2016-04-09 HISTORY — DX: Acute kidney failure, unspecified: N17.9

## 2016-04-09 HISTORY — DX: Acute kidney failure, unspecified: N18.9

## 2016-04-18 DIAGNOSIS — F411 Generalized anxiety disorder: Secondary | ICD-10-CM | POA: Insufficient documentation

## 2016-04-18 HISTORY — DX: Generalized anxiety disorder: F41.1

## 2016-04-19 DIAGNOSIS — L89152 Pressure ulcer of sacral region, stage 2: Secondary | ICD-10-CM

## 2016-04-19 HISTORY — DX: Pressure ulcer of sacral region, stage 2: L89.152

## 2016-04-22 DIAGNOSIS — G903 Multi-system degeneration of the autonomic nervous system: Secondary | ICD-10-CM

## 2016-04-22 DIAGNOSIS — G63 Polyneuropathy in diseases classified elsewhere: Secondary | ICD-10-CM

## 2016-04-22 HISTORY — DX: Polyneuropathy in diseases classified elsewhere: G63

## 2016-04-22 HISTORY — DX: Multi-system degeneration of the autonomic nervous system: G90.3

## 2016-07-10 HISTORY — PX: EXTERNAL EAR SURGERY: SHX627

## 2016-11-30 DIAGNOSIS — E875 Hyperkalemia: Secondary | ICD-10-CM | POA: Insufficient documentation

## 2016-11-30 DIAGNOSIS — S92355D Nondisplaced fracture of fifth metatarsal bone, left foot, subsequent encounter for fracture with routine healing: Secondary | ICD-10-CM

## 2016-11-30 DIAGNOSIS — S92252A Displaced fracture of navicular [scaphoid] of left foot, initial encounter for closed fracture: Secondary | ICD-10-CM | POA: Insufficient documentation

## 2016-11-30 HISTORY — DX: Displaced fracture of navicular (scaphoid) of left foot, initial encounter for closed fracture: S92.252A

## 2016-11-30 HISTORY — DX: Nondisplaced fracture of fifth metatarsal bone, left foot, subsequent encounter for fracture with routine healing: S92.355D

## 2016-11-30 HISTORY — DX: Hyperkalemia: E87.5

## 2016-12-01 DIAGNOSIS — G5631 Lesion of radial nerve, right upper limb: Secondary | ICD-10-CM

## 2016-12-01 HISTORY — DX: Lesion of radial nerve, right upper limb: G56.31

## 2017-01-05 DIAGNOSIS — M51369 Other intervertebral disc degeneration, lumbar region without mention of lumbar back pain or lower extremity pain: Secondary | ICD-10-CM

## 2017-01-05 DIAGNOSIS — M5136 Other intervertebral disc degeneration, lumbar region: Secondary | ICD-10-CM

## 2017-01-05 HISTORY — DX: Other intervertebral disc degeneration, lumbar region: M51.36

## 2017-01-05 HISTORY — DX: Other intervertebral disc degeneration, lumbar region without mention of lumbar back pain or lower extremity pain: M51.369

## 2017-02-09 DIAGNOSIS — S72301A Unspecified fracture of shaft of right femur, initial encounter for closed fracture: Secondary | ICD-10-CM

## 2017-02-09 DIAGNOSIS — S72001A Fracture of unspecified part of neck of right femur, initial encounter for closed fracture: Secondary | ICD-10-CM | POA: Insufficient documentation

## 2017-02-09 HISTORY — DX: Unspecified fracture of shaft of right femur, initial encounter for closed fracture: S72.301A

## 2017-02-09 HISTORY — DX: Fracture of unspecified part of neck of right femur, initial encounter for closed fracture: S72.001A

## 2017-02-28 DIAGNOSIS — B9620 Unspecified Escherichia coli [E. coli] as the cause of diseases classified elsewhere: Secondary | ICD-10-CM | POA: Insufficient documentation

## 2017-02-28 DIAGNOSIS — N390 Urinary tract infection, site not specified: Secondary | ICD-10-CM | POA: Insufficient documentation

## 2017-02-28 HISTORY — DX: Urinary tract infection, site not specified: B96.20

## 2017-09-24 HISTORY — PX: AV FISTULA PLACEMENT: SHX1204

## 2017-09-24 HISTORY — PX: NERVE SURGERY: SHX1016

## 2018-08-23 DIAGNOSIS — R778 Other specified abnormalities of plasma proteins: Secondary | ICD-10-CM

## 2018-08-23 DIAGNOSIS — R7989 Other specified abnormal findings of blood chemistry: Secondary | ICD-10-CM

## 2018-08-23 HISTORY — DX: Other specified abnormalities of plasma proteins: R77.8

## 2018-08-23 HISTORY — DX: Other specified abnormal findings of blood chemistry: R79.89

## 2019-05-28 DIAGNOSIS — G2581 Restless legs syndrome: Secondary | ICD-10-CM

## 2019-05-28 HISTORY — DX: Restless legs syndrome: G25.81

## 2019-12-16 DIAGNOSIS — Z8679 Personal history of other diseases of the circulatory system: Secondary | ICD-10-CM | POA: Insufficient documentation

## 2019-12-16 HISTORY — DX: Personal history of other diseases of the circulatory system: Z86.79

## 2020-02-09 HISTORY — PX: CARDIAC VALVE REPLACEMENT: SHX585

## 2020-04-07 DIAGNOSIS — Z950 Presence of cardiac pacemaker: Secondary | ICD-10-CM

## 2020-04-07 HISTORY — DX: Presence of cardiac pacemaker: Z95.0

## 2020-06-12 DIAGNOSIS — F32A Depression, unspecified: Secondary | ICD-10-CM

## 2020-06-12 DIAGNOSIS — K920 Hematemesis: Secondary | ICD-10-CM

## 2020-06-12 DIAGNOSIS — I5033 Acute on chronic diastolic (congestive) heart failure: Secondary | ICD-10-CM

## 2020-06-12 DIAGNOSIS — F17200 Nicotine dependence, unspecified, uncomplicated: Secondary | ICD-10-CM

## 2020-06-12 DIAGNOSIS — R262 Difficulty in walking, not elsewhere classified: Secondary | ICD-10-CM

## 2020-06-12 DIAGNOSIS — G894 Chronic pain syndrome: Secondary | ICD-10-CM | POA: Insufficient documentation

## 2020-06-12 HISTORY — DX: Chronic pain syndrome: G89.4

## 2020-06-12 HISTORY — DX: Difficulty in walking, not elsewhere classified: R26.2

## 2020-06-12 HISTORY — DX: Nicotine dependence, unspecified, uncomplicated: F17.200

## 2020-06-12 HISTORY — DX: Acute on chronic diastolic (congestive) heart failure: I50.33

## 2020-06-12 HISTORY — DX: Hematemesis: K92.0

## 2020-06-12 HISTORY — DX: Depression, unspecified: F32.A

## 2020-06-17 DIAGNOSIS — K922 Gastrointestinal hemorrhage, unspecified: Secondary | ICD-10-CM

## 2020-06-17 DIAGNOSIS — R571 Hypovolemic shock: Secondary | ICD-10-CM

## 2020-06-17 DIAGNOSIS — I70202 Unspecified atherosclerosis of native arteries of extremities, left leg: Secondary | ICD-10-CM

## 2020-06-17 HISTORY — DX: Unspecified atherosclerosis of native arteries of extremities, left leg: I70.202

## 2020-06-17 HISTORY — PX: FEMORAL EXPLORATION: SHX1585

## 2020-06-17 HISTORY — DX: Hypovolemic shock: R57.1

## 2020-06-17 HISTORY — DX: Gastrointestinal hemorrhage, unspecified: K92.2

## 2020-06-17 HISTORY — PX: FEMORAL BYPASS: SHX50

## 2020-06-18 DIAGNOSIS — L8942 Pressure ulcer of contiguous site of back, buttock and hip, stage 2: Secondary | ICD-10-CM

## 2020-06-18 HISTORY — DX: Pressure ulcer of contiguous site of back, buttock and hip, stage 2: L89.42

## 2020-06-19 DIAGNOSIS — D62 Acute posthemorrhagic anemia: Secondary | ICD-10-CM | POA: Insufficient documentation

## 2020-06-19 DIAGNOSIS — R059 Cough, unspecified: Secondary | ICD-10-CM | POA: Insufficient documentation

## 2020-06-19 HISTORY — DX: Acute posthemorrhagic anemia: D62

## 2020-06-19 HISTORY — DX: Cough, unspecified: R05.9

## 2020-06-24 DIAGNOSIS — E440 Moderate protein-calorie malnutrition: Secondary | ICD-10-CM | POA: Insufficient documentation

## 2020-06-24 HISTORY — DX: Moderate protein-calorie malnutrition: E44.0

## 2020-08-11 DIAGNOSIS — I739 Peripheral vascular disease, unspecified: Secondary | ICD-10-CM | POA: Insufficient documentation

## 2020-08-11 DIAGNOSIS — I248 Other forms of acute ischemic heart disease: Secondary | ICD-10-CM

## 2020-08-11 DIAGNOSIS — I2489 Other forms of acute ischemic heart disease: Secondary | ICD-10-CM

## 2020-08-11 HISTORY — DX: Other forms of acute ischemic heart disease: I24.8

## 2020-08-11 HISTORY — DX: Peripheral vascular disease, unspecified: I73.9

## 2020-08-11 HISTORY — DX: Other forms of acute ischemic heart disease: I24.89

## 2020-08-16 DIAGNOSIS — R5381 Other malaise: Secondary | ICD-10-CM

## 2020-08-16 DIAGNOSIS — D582 Other hemoglobinopathies: Secondary | ICD-10-CM

## 2020-08-16 DIAGNOSIS — K289 Gastrojejunal ulcer, unspecified as acute or chronic, without hemorrhage or perforation: Secondary | ICD-10-CM

## 2020-08-16 DIAGNOSIS — D696 Thrombocytopenia, unspecified: Secondary | ICD-10-CM

## 2020-08-16 HISTORY — DX: Gastrojejunal ulcer, unspecified as acute or chronic, without hemorrhage or perforation: K28.9

## 2020-08-16 HISTORY — DX: Other hemoglobinopathies: D58.2

## 2020-08-16 HISTORY — DX: Other malaise: R53.81

## 2020-08-16 HISTORY — DX: Thrombocytopenia, unspecified: D69.6

## 2020-08-17 DIAGNOSIS — E782 Mixed hyperlipidemia: Secondary | ICD-10-CM | POA: Insufficient documentation

## 2020-08-17 DIAGNOSIS — Z952 Presence of prosthetic heart valve: Secondary | ICD-10-CM | POA: Insufficient documentation

## 2020-08-17 DIAGNOSIS — I700 Atherosclerosis of aorta: Secondary | ICD-10-CM | POA: Insufficient documentation

## 2020-08-17 DIAGNOSIS — E119 Type 2 diabetes mellitus without complications: Secondary | ICD-10-CM | POA: Insufficient documentation

## 2020-08-17 DIAGNOSIS — I10 Essential (primary) hypertension: Secondary | ICD-10-CM

## 2020-08-17 DIAGNOSIS — I359 Nonrheumatic aortic valve disorder, unspecified: Secondary | ICD-10-CM

## 2020-08-17 DIAGNOSIS — I6523 Occlusion and stenosis of bilateral carotid arteries: Secondary | ICD-10-CM

## 2020-08-17 DIAGNOSIS — I714 Abdominal aortic aneurysm, without rupture, unspecified: Secondary | ICD-10-CM | POA: Insufficient documentation

## 2020-08-17 DIAGNOSIS — E559 Vitamin D deficiency, unspecified: Secondary | ICD-10-CM | POA: Insufficient documentation

## 2020-08-17 DIAGNOSIS — F329 Major depressive disorder, single episode, unspecified: Secondary | ICD-10-CM | POA: Insufficient documentation

## 2020-08-17 DIAGNOSIS — K219 Gastro-esophageal reflux disease without esophagitis: Secondary | ICD-10-CM | POA: Insufficient documentation

## 2020-08-17 DIAGNOSIS — D531 Other megaloblastic anemias, not elsewhere classified: Secondary | ICD-10-CM | POA: Insufficient documentation

## 2020-08-17 DIAGNOSIS — J449 Chronic obstructive pulmonary disease, unspecified: Secondary | ICD-10-CM | POA: Insufficient documentation

## 2020-08-17 DIAGNOSIS — E1165 Type 2 diabetes mellitus with hyperglycemia: Secondary | ICD-10-CM | POA: Insufficient documentation

## 2020-08-17 HISTORY — DX: Presence of prosthetic heart valve: Z95.2

## 2020-08-17 HISTORY — DX: Mixed hyperlipidemia: E78.2

## 2020-08-17 HISTORY — DX: Type 2 diabetes mellitus without complications: E11.9

## 2020-08-17 HISTORY — DX: Abdominal aortic aneurysm, without rupture: I71.4

## 2020-08-17 HISTORY — DX: Abdominal aortic aneurysm, without rupture, unspecified: I71.40

## 2020-08-17 HISTORY — DX: Gastro-esophageal reflux disease without esophagitis: K21.9

## 2020-08-17 HISTORY — DX: Other megaloblastic anemias, not elsewhere classified: D53.1

## 2020-08-17 HISTORY — DX: Atherosclerosis of aorta: I70.0

## 2020-08-17 HISTORY — DX: Nonrheumatic aortic valve disorder, unspecified: I35.9

## 2020-08-17 HISTORY — DX: Type 2 diabetes mellitus with hyperglycemia: E11.65

## 2020-08-17 HISTORY — DX: Major depressive disorder, single episode, unspecified: F32.9

## 2020-08-17 HISTORY — DX: Essential (primary) hypertension: I10

## 2020-08-17 HISTORY — DX: Occlusion and stenosis of bilateral carotid arteries: I65.23

## 2020-08-17 HISTORY — DX: Vitamin D deficiency, unspecified: E55.9

## 2020-08-17 HISTORY — DX: Chronic obstructive pulmonary disease, unspecified: J44.9

## 2020-09-23 ENCOUNTER — Ambulatory Visit: Payer: Medicare Other | Admitting: Podiatry

## 2020-09-24 ENCOUNTER — Encounter: Payer: Self-pay | Admitting: Sports Medicine

## 2020-09-24 ENCOUNTER — Ambulatory Visit (INDEPENDENT_AMBULATORY_CARE_PROVIDER_SITE_OTHER): Payer: Medicare Other | Admitting: Sports Medicine

## 2020-09-24 ENCOUNTER — Other Ambulatory Visit: Payer: Self-pay

## 2020-09-24 DIAGNOSIS — K5900 Constipation, unspecified: Secondary | ICD-10-CM | POA: Insufficient documentation

## 2020-09-24 DIAGNOSIS — Z79899 Other long term (current) drug therapy: Secondary | ICD-10-CM

## 2020-09-24 DIAGNOSIS — G4700 Insomnia, unspecified: Secondary | ICD-10-CM | POA: Insufficient documentation

## 2020-09-24 DIAGNOSIS — M5386 Other specified dorsopathies, lumbar region: Secondary | ICD-10-CM | POA: Insufficient documentation

## 2020-09-24 DIAGNOSIS — T148XXA Other injury of unspecified body region, initial encounter: Secondary | ICD-10-CM

## 2020-09-24 DIAGNOSIS — G9009 Other idiopathic peripheral autonomic neuropathy: Secondary | ICD-10-CM | POA: Insufficient documentation

## 2020-09-24 DIAGNOSIS — I739 Peripheral vascular disease, unspecified: Secondary | ICD-10-CM | POA: Diagnosis not present

## 2020-09-24 DIAGNOSIS — I83813 Varicose veins of bilateral lower extremities with pain: Secondary | ICD-10-CM

## 2020-09-24 DIAGNOSIS — Z9889 Other specified postprocedural states: Secondary | ICD-10-CM | POA: Insufficient documentation

## 2020-09-24 DIAGNOSIS — Z7409 Other reduced mobility: Secondary | ICD-10-CM

## 2020-09-24 DIAGNOSIS — M79674 Pain in right toe(s): Secondary | ICD-10-CM

## 2020-09-24 DIAGNOSIS — L853 Xerosis cutis: Secondary | ICD-10-CM | POA: Diagnosis not present

## 2020-09-24 DIAGNOSIS — R1310 Dysphagia, unspecified: Secondary | ICD-10-CM | POA: Insufficient documentation

## 2020-09-24 DIAGNOSIS — I509 Heart failure, unspecified: Secondary | ICD-10-CM

## 2020-09-24 DIAGNOSIS — I5023 Acute on chronic systolic (congestive) heart failure: Secondary | ICD-10-CM | POA: Insufficient documentation

## 2020-09-24 DIAGNOSIS — F330 Major depressive disorder, recurrent, mild: Secondary | ICD-10-CM | POA: Insufficient documentation

## 2020-09-24 DIAGNOSIS — M86169 Other acute osteomyelitis, unspecified tibia and fibula: Secondary | ICD-10-CM

## 2020-09-24 DIAGNOSIS — M79671 Pain in right foot: Secondary | ICD-10-CM

## 2020-09-24 DIAGNOSIS — M79675 Pain in left toe(s): Secondary | ICD-10-CM

## 2020-09-24 DIAGNOSIS — E042 Nontoxic multinodular goiter: Secondary | ICD-10-CM | POA: Insufficient documentation

## 2020-09-24 DIAGNOSIS — B351 Tinea unguium: Secondary | ICD-10-CM | POA: Diagnosis not present

## 2020-09-24 DIAGNOSIS — M79672 Pain in left foot: Secondary | ICD-10-CM

## 2020-09-24 DIAGNOSIS — I150 Renovascular hypertension: Secondary | ICD-10-CM | POA: Insufficient documentation

## 2020-09-24 DIAGNOSIS — Z5181 Encounter for therapeutic drug level monitoring: Secondary | ICD-10-CM | POA: Insufficient documentation

## 2020-09-24 HISTORY — DX: Acute on chronic systolic (congestive) heart failure: I50.23

## 2020-09-24 HISTORY — DX: Heart failure, unspecified: I50.9

## 2020-09-24 HISTORY — DX: Varicose veins of bilateral lower extremities with pain: I83.813

## 2020-09-24 HISTORY — DX: Insomnia, unspecified: G47.00

## 2020-09-24 HISTORY — DX: Constipation, unspecified: K59.00

## 2020-09-24 HISTORY — DX: Other idiopathic peripheral autonomic neuropathy: G90.09

## 2020-09-24 HISTORY — DX: Other long term (current) drug therapy: Z79.899

## 2020-09-24 HISTORY — DX: Other reduced mobility: Z74.09

## 2020-09-24 HISTORY — DX: Other acute osteomyelitis, unspecified tibia and fibula: M86.169

## 2020-09-24 HISTORY — DX: Other specified dorsopathies, lumbar region: M53.86

## 2020-09-24 HISTORY — DX: Encounter for therapeutic drug level monitoring: Z51.81

## 2020-09-24 HISTORY — DX: Major depressive disorder, recurrent, mild: F33.0

## 2020-09-24 HISTORY — DX: Other specified postprocedural states: Z98.890

## 2020-09-24 HISTORY — DX: Nontoxic multinodular goiter: E04.2

## 2020-09-24 HISTORY — DX: Renovascular hypertension: I15.0

## 2020-09-24 HISTORY — DX: Dysphagia, unspecified: R13.10

## 2020-09-24 NOTE — Progress Notes (Signed)
Subjective: Holly Pittman is a 75 y.o. female patient seen today in office with complaint of mildly painful thickened and elongated toenails; unable to trim. Patient reports that she also has some dry peeling skin and states that she was in the hospital ever since last August due to issues with her heart and pacemaker reports that she dropped something on the top of her foot 6 to 8 months ago no longer painful but does notice a spot there and states that she is on dialysis as well 3 times a week due to stage IV kidney disease.  Patient is from [facility] facility.  Patient Active Problem List   Diagnosis Date Noted  . Acute on chronic systolic heart failure ([place]) 09/24/2020  . Acute osteomyelitis of lower leg ([place]) 09/24/2020  . Constipation 09/24/2020  . Decreased range of motion of intervertebral discs of lumbar spine 09/24/2020  . Dysphagia 09/24/2020  . Heart failure ([place]) 09/24/2020  . Idiopathic peripheral autonomic neuropathy 09/24/2020  . Impaired functional mobility, balance, gait, and endurance 09/24/2020  . Insomnia 09/24/2020  . Mild recurrent major depression ([place]) 09/24/2020  . Non-toxic multinodular goiter 09/24/2020  . Other long term (current) drug therapy 09/24/2020  . Encounter for therapeutic drug level monitoring 09/24/2020  . Previous back surgery 09/24/2020  . 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 Varicose veins of bilateral lower extremities with pain 09/24/2020  . Abdominal aortic aneurysm, without rupture ([place]) 08/17/2020  . Aortic valve disorder 08/17/2020  . Chronic obstructive pulmonary disease ([place]) 08/17/2020  . Essential hypertension 08/17/2020  . Gastro-esophageal reflux disease without esophagitis 08/17/2020  . Hardening of the aorta (main artery of the heart) ([place]) 08/17/2020  . History of aortic valve replacement 08/17/2020  . Major depression, single episode 08/17/2020  . Megaloblastic anemia due to vitamin B12 deficiency 08/17/2020  . Mixed  hyperlipidemia 08/17/2020  . Occlusion and stenosis of bilateral carotid arteries 08/17/2020  . Type 2 diabetes mellitus with hyperglycemia ([place]) 08/17/2020  . Type 2 diabetes mellitus without complication ([place]) 0000000  . Vitamin D deficiency 08/17/2020  . Gastrojejunal ulcer 08/16/2020  . Physical deconditioning 08/16/2020  . Significant drop in hemoglobin ([place]) 08/16/2020  . Thrombocytopenia ([place]) 08/16/2020  . Demand ischemia ([place]) 08/11/2020  . Peripheral vascular disease, unspecified ([place]) 08/11/2020  . Moderate protein-calorie malnutrition ([place]) 06/24/2020  . Acute blood loss anemia 06/19/2020  . Cough 06/19/2020  . Pressure injury of contiguous region involving back and buttock, stage 2 ([place]) 06/18/2020  . Gastrointestinal hemorrhage, unspecified 06/17/2020  . Hypovolemic shock ([place]) 06/17/2020  . Popliteal artery occlusion, left ([place]) 06/17/2020  . Acute on chronic diastolic CHF (congestive heart failure) ([place]) 06/12/2020  . Coffee ground emesis 06/12/2020  . Depression 06/12/2020  . Difficulty in walking, not elsewhere classified 06/12/2020  . Chronic pain syndrome 06/12/2020  . Tobacco dependence 06/12/2020  . S/P placement of leadless cardiac pacemaker 04/07/2020  . History of aortic valve stenosis 12/16/2019  . Restless legs syndrome 05/28/2019  . Troponin level elevated 08/23/2018  . E-coli UTI 02/28/2017  . Closed fracture of shaft of right femur ([place]) 02/09/2017  . Fracture of unspecified part of neck of right femur, initial encounter for closed fracture ([place]) 02/09/2017  . Disc degeneration, lumbar 01/05/2017  . Radial nerve palsy, right 12/01/2016  . Closed avulsion fracture of navicular bone of left foot 11/30/2016  . Closed nondisplaced fracture of fifth metatarsal bone of left foot with routine healing 11/30/2016  . Hyperkalemia 11/30/2016  . 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 Decubitus ulcer of sacral  region, stage 2 ([place]) 04/19/2016  . Generalized anxiety disorder 04/18/2016  . Acute on chronic renal failure ([place]) 04/09/2016  . Smoker 07/26/2015  . Dilation of biliary tract 06/09/2014  . Midline low back pain with sciatica 04/15/2013  . Lumbar radicular pain 04/15/2013  . Hip pain 09/27/2011  . History of total knee replacement 09/27/2011  . History of gastric bypass 05/08/2011  . Arthritis 05/08/2011  . DVT femoral (deep venous thrombosis) with thrombophlebitis ([place]) 07/30/1978    Current Outpatient Medications on File Prior to Visit  Medication Sig Dispense Refill  . ALPRAZolam (XANAX) 0.5 MG tablet 1 tablet    . b complex-vitamin c-folic acid (NEPHRO-VITE) 0.8 MG TABS tablet Take 1 tablet by mouth daily.    . busPIRone (BUSPAR) 10 MG tablet Take by mouth.    . clopidogrel (PLAVIX) 75 MG tablet Take by mouth.    . finasteride (PROSCAR) 5 MG tablet Take by mouth.    . fluticasone (FLONASE) 50 MCG/ACT nasal spray Place into the nose.    . hydrOXYzine (ATARAX/VISTARIL) 25 MG tablet Take by mouth.    . levothyroxine (SYNTHROID) 50 MCG tablet Take 1 tablet by mouth daily.    . ondansetron (ZOFRAN) 4 MG tablet Take by mouth.    Marland Kitchen rOPINIRole (REQUIP) 0.25 MG tablet TAKE 1 TABLET BY MOUTH 1-3 HOURS BEFORE BEDTIME    . acetaminophen (TYLENOL) 325 MG tablet Take by mouth.    Marland Kitchen albuterol (VENTOLIN HFA) 108 (90 Base) MCG/ACT inhaler INHALE 2 PUFFS BY MOUTH EVERY 4 HOURS ASNEEDED.    Marland Kitchen ALPRAZolam (XANAX) 0.25 MG tablet Take 0.25 mg by mouth 2 (two) times daily as needed.    Marland Kitchen aspirin 325 MG tablet 1 tablet    . azithromycin (ZITHROMAX) 250 MG tablet Take 250 mg by mouth as directed.    . B Complex Vitamins (VITAMIN B COMPLEX) TABS Take 1 tablet by mouth daily.    Marland Kitchen BREO ELLIPTA 200-25 MCG/INH AEPB Inhale 1 puff into the lungs daily.    Marland Kitchen buPROPion (WELLBUTRIN XL) 150 MG 24 hr tablet 1 tablet in the morning    . calcium acetate, Phos Binder, (PHOSLYRA) 667 MG/5ML SOLN Take by mouth.    .  cephALEXin (KEFLEX) 500 MG capsule Take 500 mg by mouth 4 (four) times daily.    . Cholecalciferol 125 MCG (5000 UT) capsule Take by mouth.    . diclofenac Sodium (VOLTAREN) 1 % GEL APPLY TO AFFECTED AREA(S) AS NEEDED TWICE A DAY.    Mariane Baumgarten Sodium (DSS) 100 MG CAPS Take by mouth.    Water engineer Bandages & Supports (MEDICAL COMPRESSION THIGH HIGH) MISC 20-61mHg to be worn bilaterally daily    . ferrous sulfate 325 (65 FE) MG tablet 1 tablet    . furosemide (LASIX) 40 MG tablet TAKE 4 TABLETS BY MOUTH ONCE DAILY.    .Marland Kitchengabapentin (NEURONTIN) 300 MG capsule Take by mouth.    . Hydrocortisone-Aloe 0.5 % CREA Apply topically.    . lidocaine (LIDODERM) 5 % 1 patch daily.    .Marland Kitchenlidocaine-prilocaine (EMLA) cream SMARTSIG:1 Sparingly Topical As Directed    . meclizine (ANTIVERT) 25 MG tablet 1 tablet as needed    . metolazone (ZAROXOLYN) 2.5 MG tablet TAKE 1 TABLET BY MOUTH 3 TIMES A WEEK AS DIRECTED    . midodrine (PROAMATINE) 10 MG tablet Take by mouth.    . oxyCODONE (ROXICODONE) 15 MG immediate release tablet  Take by mouth.    . pantoprazole sodium (PROTONIX) 40 mg/20 mL PACK Take by mouth.    . polyethylene glycol powder (GLYCOLAX/MIRALAX) 17 GM/SCOOP powder Take by mouth.    . pregabalin (LYRICA) 50 MG capsule Take by mouth.    . sennosides-docusate sodium (SENOKOT-S) 8.6-50 MG tablet 2 tablet    . sucralfate (CARAFATE) 1 GM/10ML suspension SMARTSIG:Milliliter(s) By Mouth    . sulfamethoxazole-trimethoprim (BACTRIM) 400-80 MG tablet Take 1 tablet by mouth 2 (two) times daily.    Marland Kitchen triamcinolone (KENALOG) 0.1 % 1 application    . TRINTELLIX 10 MG TABS tablet Take 10 mg by mouth daily.    . Zinc Oxide (SKIN PROTECTANT) 12 % CREA See admin instructions.     No current facility-administered medications on file prior to visit.    Allergies  Allergen Reactions  . Shrimp Extract Allergy Skin Test Swelling    Mouth Mouth Mouth   . Hydrocodone Itching  . Tape Rash    Objective: Physical  Exam  General: Well developed, nourished, no acute distress, awake, alert and oriented x 3  Vascular: Dorsalis pedis artery 0/4 bilateral, Posterior tibial artery 0/4 bilateral, skin temperature warm to warm proximal to distal bilateral lower extremities, mild varicosities, no pedal hair present bilateral.  2+ pitting edema bilateral.  Neurological: Gross sensation present via light touch bilateral.   Dermatological: Skin is warm, dry, and supple bilateral, Nails 1-10 are tender, long, thick, and discolored with mild subungal debris, no webspace macerations present bilateral, bruise noted to the dorsum of the right foot nonpainful, no open lesions present bilateral, dry skin plantar surfaces bilateral, no signs of infection bilateral.  Musculoskeletal: Asymptomatic pes planus boney deformities noted bilateral.  Muscle strength 4 out of 5 bilateral.  No pain with palpation to calf.  Assessment and Plan:  Problem List Items Addressed This Visit   None   Visit Diagnoses    Pain due to onychomycosis of toenails of both feet    -  Primary   Relevant Medications   azithromycin (ZITHROMAX) 250 MG tablet   cephALEXin (KEFLEX) 500 MG capsule   sulfamethoxazole-trimethoprim (BACTRIM) 400-80 MG tablet   Dry skin       Bruise       PVD (peripheral vascular disease) (HCC)       Relevant Medications   aspirin 325 MG tablet   furosemide (LASIX) 40 MG tablet   metolazone (ZAROXOLYN) 2.5 MG tablet   midodrine (PROAMATINE) 10 MG tablet   Foot pain, bilateral          -Examined patient.  -Discussed treatment options for painful mycotic nails. -Mechanically debrided and reduced mycotic nails with sterile nail nipper and dremel nail file without incident. -Gave sample of foot miracle cream for patient to use for dry skin -Advised patient that we will closely monitor bruise over the top of right foot at this time no acute issues to warrant further treatment -Encourage elevation of legs to assist  with edema control and to continue with cardiac and nephrology follow-up -Orders written as above given to facility at [facility] -Patient to return in 3 months for follow up evaluation or sooner if symptoms worsen.  Landis Martins, DPM

## 2020-09-30 ENCOUNTER — Encounter: Payer: Self-pay | Admitting: Cardiology

## 2020-09-30 ENCOUNTER — Encounter: Payer: Self-pay | Admitting: *Deleted

## 2020-10-01 ENCOUNTER — Encounter: Payer: Self-pay | Admitting: Cardiology

## 2020-10-12 ENCOUNTER — Ambulatory Visit: Payer: Medicare Other | Admitting: Cardiology

## 2020-10-27 ENCOUNTER — Ambulatory Visit: Payer: Medicare Other | Admitting: Cardiology

## 2020-11-09 DIAGNOSIS — N186 End stage renal disease: Secondary | ICD-10-CM | POA: Insufficient documentation

## 2020-11-10 ENCOUNTER — Ambulatory Visit: Payer: Medicare Other | Admitting: Cardiology

## 2020-12-23 ENCOUNTER — Other Ambulatory Visit: Payer: Self-pay

## 2020-12-27 ENCOUNTER — Ambulatory Visit: Payer: Medicare Other | Admitting: Cardiology

## 2020-12-28 ENCOUNTER — Ambulatory Visit: Payer: Medicare Other | Admitting: Sports Medicine

## 2021-01-28 DIAGNOSIS — M19019 Primary osteoarthritis, unspecified shoulder: Secondary | ICD-10-CM

## 2021-01-28 DIAGNOSIS — M19029 Primary osteoarthritis, unspecified elbow: Secondary | ICD-10-CM

## 2021-01-28 DIAGNOSIS — M75112 Incomplete rotator cuff tear or rupture of left shoulder, not specified as traumatic: Secondary | ICD-10-CM

## 2021-01-28 HISTORY — DX: Incomplete rotator cuff tear or rupture of left shoulder, not specified as traumatic: M75.112

## 2021-01-28 HISTORY — DX: Primary osteoarthritis, unspecified shoulder: M19.019

## 2021-01-28 HISTORY — DX: Primary osteoarthritis, unspecified elbow: M19.029

## 2021-02-01 ENCOUNTER — Ambulatory Visit: Payer: Medicare Other | Admitting: Cardiology

## 2021-03-16 ENCOUNTER — Ambulatory Visit: Payer: Medicare Other | Admitting: Cardiology
